# Patient Record
Sex: Female | Born: 1960 | Race: White | Hispanic: No | Marital: Married | Smoking: Never smoker
Health system: Southern US, Community
[De-identification: ages and names within clinical notes are randomized; demographics above are authoritative.]

## PROBLEM LIST (undated history)

## (undated) DIAGNOSIS — D126 Benign neoplasm of colon, unspecified: Secondary | ICD-10-CM

## (undated) DIAGNOSIS — I499 Cardiac arrhythmia, unspecified: Secondary | ICD-10-CM

## (undated) HISTORY — DX: Benign neoplasm of colon, unspecified: D12.6

## (undated) HISTORY — DX: Cardiac arrhythmia, unspecified: I49.9

## (undated) HISTORY — PX: OTHER SURGICAL HISTORY: SHX169

---

## 1997-07-01 ENCOUNTER — Ambulatory Visit (HOSPITAL_COMMUNITY): Admission: RE | Admit: 1997-07-01 | Discharge: 1997-07-01 | Payer: Self-pay | Admitting: Obstetrics & Gynecology

## 1999-09-11 ENCOUNTER — Other Ambulatory Visit: Admission: RE | Admit: 1999-09-11 | Discharge: 1999-09-11 | Payer: Self-pay | Admitting: Obstetrics & Gynecology

## 2000-11-27 ENCOUNTER — Other Ambulatory Visit: Admission: RE | Admit: 2000-11-27 | Discharge: 2000-11-27 | Payer: Self-pay | Admitting: Obstetrics & Gynecology

## 2001-12-01 ENCOUNTER — Other Ambulatory Visit: Admission: RE | Admit: 2001-12-01 | Discharge: 2001-12-01 | Payer: Self-pay | Admitting: Obstetrics & Gynecology

## 2003-07-25 ENCOUNTER — Other Ambulatory Visit: Admission: RE | Admit: 2003-07-25 | Discharge: 2003-07-25 | Payer: Self-pay | Admitting: Obstetrics & Gynecology

## 2004-07-26 ENCOUNTER — Other Ambulatory Visit: Admission: RE | Admit: 2004-07-26 | Discharge: 2004-07-26 | Payer: Self-pay | Admitting: Obstetrics & Gynecology

## 2006-12-31 ENCOUNTER — Ambulatory Visit: Payer: Self-pay | Admitting: Cardiology

## 2007-01-01 ENCOUNTER — Ambulatory Visit: Payer: Self-pay | Admitting: Cardiology

## 2007-01-30 ENCOUNTER — Ambulatory Visit: Payer: Self-pay | Admitting: Cardiology

## 2007-02-09 ENCOUNTER — Ambulatory Visit: Payer: Self-pay

## 2007-02-09 ENCOUNTER — Encounter: Payer: Self-pay | Admitting: Cardiology

## 2007-03-18 ENCOUNTER — Ambulatory Visit: Payer: Self-pay | Admitting: Cardiology

## 2007-05-19 ENCOUNTER — Ambulatory Visit: Payer: Self-pay | Admitting: Cardiology

## 2007-11-20 ENCOUNTER — Ambulatory Visit: Payer: Self-pay | Admitting: Cardiology

## 2008-09-23 ENCOUNTER — Encounter (INDEPENDENT_AMBULATORY_CARE_PROVIDER_SITE_OTHER): Payer: Self-pay | Admitting: *Deleted

## 2008-12-07 DIAGNOSIS — R0602 Shortness of breath: Secondary | ICD-10-CM | POA: Insufficient documentation

## 2008-12-07 DIAGNOSIS — R000 Tachycardia, unspecified: Secondary | ICD-10-CM | POA: Insufficient documentation

## 2008-12-07 DIAGNOSIS — R002 Palpitations: Secondary | ICD-10-CM | POA: Insufficient documentation

## 2008-12-07 DIAGNOSIS — D126 Benign neoplasm of colon, unspecified: Secondary | ICD-10-CM | POA: Insufficient documentation

## 2008-12-08 ENCOUNTER — Ambulatory Visit: Payer: Self-pay | Admitting: Cardiology

## 2008-12-08 DIAGNOSIS — R5383 Other fatigue: Secondary | ICD-10-CM

## 2008-12-08 DIAGNOSIS — R5381 Other malaise: Secondary | ICD-10-CM | POA: Insufficient documentation

## 2008-12-13 ENCOUNTER — Telehealth: Payer: Self-pay | Admitting: Cardiology

## 2008-12-29 ENCOUNTER — Telehealth: Payer: Self-pay | Admitting: Cardiology

## 2008-12-29 ENCOUNTER — Encounter (INDEPENDENT_AMBULATORY_CARE_PROVIDER_SITE_OTHER): Payer: Self-pay | Admitting: *Deleted

## 2009-01-03 ENCOUNTER — Telehealth: Payer: Self-pay | Admitting: Cardiology

## 2009-11-27 ENCOUNTER — Ambulatory Visit: Payer: Self-pay

## 2009-11-27 ENCOUNTER — Encounter: Payer: Self-pay | Admitting: Cardiology

## 2009-11-29 ENCOUNTER — Ambulatory Visit: Payer: Self-pay

## 2009-11-29 ENCOUNTER — Ambulatory Visit: Payer: Self-pay | Admitting: Cardiology

## 2010-05-08 NOTE — Assessment & Plan Note (Signed)
Summary: 1 yr rov fatigue palps  pfh,rn  Medications Added CALTRATE 600+D PLUS 600-400 MG-UNIT TABS (CALCIUM CARBONATE-VIT D-MIN) take one tablet two times a day      Allergies Added: NKDA  Visit Type:  Follow-up Primary Provider:  Dr. Aldona Bar  CC:  palpitations.  History of Present Illness: The patient presents for evaluation of tachycardia palpitations. These are similar to what she had before. She's been noted to have a sinus tachycardia. She's been treated with a low dose of beta blocker. She says she still gets these. She is anxious because her heart rate is elevated today at 98 which is higher than it was last year. She occasionally gets dizziness. She still gets some dyspnea with activity such as walking. She's not had any frank syncope. She has not had any new chest pressure, neck or arm discomfort. She's not had any new PND or orthopnea. She feels fatigued and just "doesn't feel well.  Current Medications (verified): 1)  Multivitamins   Tabs (Multiple Vitamin) .... Daily 2)  Metoprolol Tartrate 25 Mg Tabs (Metoprolol Tartrate) .... One By Mouth Two Times A Day 3)  Caltrate 600+d Plus 600-400 Mg-Unit Tabs (Calcium Carbonate-Vit D-Min) .... Take One Tablet Two Times A Day  Allergies (verified): No Known Drug Allergies  Past History:  Past Medical History: Reviewed history from 12/07/2008 and no changes required. COLONIC POLYPS (ICD-211.3) DYSLIPIDEMIA (ICD-272.4) DM (ICD-250.00) TACHYCARDIA (ICD-785.0) DYSPNEA (ICD-786.05) PALPITATIONS (ICD-785.1)  Past Surgical History: Reviewed history from 12/08/2008 and no changes required. Colonic polyps removed in 2007.      Review of Systems       As stated in the HPI and negative for all other systems.   Vital Signs:  Patient profile:   50 year old female Height:      63 inches Weight:      161 pounds BMI:     28.62 Pulse rate:   110 / minute Pulse rhythm:   regular BP sitting:   123 / 82  (right arm) Cuff size:    regular  Vitals Entered By: Marrion Coy, CNA (November 27, 2009 4:44 PM)  Physical Exam  General:  Well developed, well nourished, in no acute distress. Head:  normocephalic and atraumatic Mouth:  Teeth, gums and palate normal. Oral mucosa normal. Neck:  Neck supple, no JVD. No masses, thyromegaly or abnormal cervical nodes. Chest Wall:  no deformities or breast masses noted Lungs:  Clear bilaterally to auscultation and percussion. Abdomen:  Bowel sounds positive; abdomen soft and non-tender without masses, organomegaly, or hernias noted. No hepatosplenomegaly. Msk:  Back normal, normal gait. Muscle strength and tone normal. Extremities:  No clubbing or cyanosis. Neurologic:  Alert and oriented x 3. Skin:  Intact without lesions or rashes. Cervical Nodes:  no significant adenopathy Axillary Nodes:  no significant adenopathy Inguinal Nodes:  no significant adenopathy Psych:  Normal affect.   Detailed Cardiovascular Exam  Neck    Carotids: Carotids full and equal bilaterally without bruits.      Neck Veins: Normal, no JVD.    Heart    Inspection: no deformities or lifts noted.      Palpation: normal PMI with no thrills palpable.      Auscultation: regular rate and rhythm, S1, S2 without murmurs, rubs, gallops, or clicks.    Vascular    Abdominal Aorta: no palpable masses, pulsations, or audible bruits.      Femoral Pulses: normal femoral pulses bilaterally.      Pedal Pulses: normal pedal  pulses bilaterally.      Radial Pulses: normal radial pulses bilaterally.      Peripheral Circulation: no clubbing, cyanosis, or edema noted with normal capillary refill.     EKG  Procedure date:  11/27/2009  Findings:      Sinus rhythm, rate 98, axis within normal limits, intervals within normal limits, nonspecific inferior T-wave changes  Impression & Recommendations:  Problem # 1:  TACHYCARDIA (ICD-785.0) At this point I would not suggest increasing her beta blocker as she has had  low blood pressures and I think she would be more symptomatic with med titration. Rather I spent quite a bit of time (greater than 30 minutes) discussing with her husband and the patient with values of exercise in this situation. I think prescribing and complying with an exercise regimen would make her feel much better and any further medical therapy. Toward that end I will her back for an exercise treadmill test to give her a prescription for exercise. Orders: EKG w/ Interpretation (93000) Treadmill (Treadmill)  Problem # 2:  DYSPNEA (ICD-786.05) The patient has dyspnea and nonspecific EKG changes. I think the pretest probability of obstructive coronary disease is low. I will evaluate however with an exercise treadmill test. Orders: Treadmill (Treadmill)  Problem # 3:  FATIGUE (ICD-780.79) I suspect that this is multifactorial and would improve with exercise.  Patient Instructions: 1)  Your physician recommends that you schedule a follow-up appointment iat the time of the treadmill 2)  Your physician recommends that you continue on your current medications as directed. Please refer to the Current Medication list given to you today. 3)  Your physician has requested that you have an exercise tolerance test.  For further information please visit [email or web address]  Please also follow instruction sheet, as given. Prescriptions: METOPROLOL TARTRATE 25 MG TABS (METOPROLOL TARTRATE) one by mouth two times a day  #60 x 11   Entered by:   Charolotte Capuchin, RN   Authorized by:   Rollene Rotunda, MD, [facility]   Signed by:   Charolotte Capuchin, RN on 11/27/2009   Method used:   Electronically to        [facility] [phone] (retail)       [address]       [place], [place]  [address]       Ph: [phone]       Fax: [phone]   RxID:   [id number]

## 2010-08-21 NOTE — Assessment & Plan Note (Signed)
[facility]                            CARDIOLOGY OFFICE NOTE   NAME:Carol [name], Carol [name]                      MRN:          [id number]  DATE:03/18/2007                            DOB:          1961-01-05    REASON FOR PRESENTATION:  Evaluate patient with tachycardia.   HISTORY OF PRESENT ILLNESS:  The patient presents for followup of the  above.  At the last visit I started her on propranolol 10 mg b.i.d.  She  did have some improvement with this, and had less noticeable tachy  palpitations until recently.  She had an upper respiratory infection and  saw Dr. Salli Quarry in the urgent care this weekend.  She was given  amoxicillin and Mucinex.  Yesterday she was very dizzy and nauseated.  Of note her heart rate was in the 120's when she was in [facility].  It  is being considered that she should stop the amoxicillin and switch to  another antibiotic.  Question is could the antibiotic have been related  to the increased heart rate.  She had no presyncope or syncope.  She is  not having any chest pain, PND or orthopnea.  She has cough and  congestion.   Of note the patient did have an echocardiogram after the last visit that  demonstrated a well-preserved ejection fraction,  no evidence of  valvular abnormalities.   PAST MEDICAL HISTORY:  There is no history of hypertension, diabetes or  hyperlipidemia.  Colonic polyp removed 2007.   ALLERGIES:  None.   MEDICATIONS:  1. Multivitamin.  2. Propranolol 10 mg b.i.d.   REVIEW OF SYSTEMS:  As stated in the HPI, and, otherwise, negative for  other systems.   PHYSICAL EXAMINATION:  GENERAL:  The patient is in no distress.  VITAL SIGNS:  Blood pressure 127/83, heart rate 116 and regular, weight  156 pounds, body mass index 26.  HEENT:  Eyes unremarkable, pupils equal, round, and react to light.  Fundi not visualized.  Oral mucosa unremarkable.  NECK:  No jugular venous distention at 45 degrees.   Carotid upstrokes  brisk and symmetric.  No bruits, no thyromegaly.  LYMPHATICS:  No cervical, axillary, inguinal adenopathy.  LUNGS:  Clear to auscultation bilaterally.  BACK:  No costovertebral angle tenderness.  CHEST:  Unremarkable.  HEART:  PMI not displaced or sustained.  S1 and S2 within normal limits.  No S3, no S4.  No clicks, no rubs, no murmurs.  ABDOMEN:  Obese, positive bowel sounds normal in frequency and pitch.  No bruits, no rebound, no guarding, no  midline pulsatile mass.  No  hepatomegaly, no splenomegaly.  SKIN:  No rashes or nodules.  EXTREMITIES:  2+ pulses, no edema.   ASSESSMENT/PLAN:  1. Tachy palpitations.  The patient has sinus tachycardia that is      bothersome to her.  I do not see a secondary cause for this.  I am      managing this symptomatically.  I will increase her propranolol to      20 mg b.i.d.  I think it is fine for her  to take any antibiotic as      necessary, and doubt that the medication is related to her tachy      palpitations.  However, any time the patient gets under stress      whether physical or emotional I suspect she will have a rapid heart      rate.  2. Followup.  I will see the patient back in about two months or      sooner if she has any increased palpitations.     Rollene Rotunda, MD, [facility]  Electronically Signed    JH/MedQ  DD: 03/18/2007  DT: 03/19/2007  Job #: [id number]   cc:   Gerrit Friends. Aldona Bar, M.D.

## 2010-08-21 NOTE — Assessment & Plan Note (Signed)
[facility]                            CARDIOLOGY OFFICE NOTE   NAME:Kovarik, JANAT TABBERT                      MRN:          [id number]  DATE:05/19/2007                            DOB:          1960/11/25    PRIMARY CARE PHYSICIAN:  Gerrit Friends. Aldona Bar, M.D.   REASON FOR PRESENTATION:  Evaluate the patient with tachycardia.   HISTORY OF PRESENT ILLNESS:  The patient is a 50 year old.  She presents  for followup of tachycardia.  Last visit, I did increase her propranolol  from 10 b.i.d.  to 20 b.i.d. She says she is having less tachy  palpitations unless when she really hurries.  Her resting heart rate is  slower today.  She is not having any new tiredness, but has some  baseline fatigue.  She says she is not sleeping as well at night and  thinks this may be the beta blocker.  She takes a little Tylenol PM and  this seems to help.  She is doing that sporadically.  She is very  concerned about a 7 pounds weight gain she has had in the last 6 weeks.  She blames this on the beta blockers and she has not changed her  exercise or diet.  She denies any pre-syncope or syncope.  She is not  having any chest discomfort, neck or arm discomfort.  She has some vague  shortness of breath with activity, but this does not seem to be a new  complaint.   PAST MEDICAL HISTORY:  1. Tachycardia (idiopathic sinus tachycardia).  2. Hypertension.  3. Diabetes.  4. Dyslipidemia.  5. Colonic polyps removed 2007.   ALLERGIES:  None.   MEDICATIONS:  1. Multivitamin.  2. Propranolol 20 mg b.i.d.   REVIEW OF SYSTEMS:  As stated in the HPI, otherwise negative for other  systems.   PHYSICAL EXAMINATION:  The patient is in no acute distress.  Blood  pressure 110/86, heart rate 88 and regular, weight 163 pounds, body mass  index 27.  HEENT: Eyelids unremarkable. Pupils equal, round, and reactive to light.  Fundi not visualized. Oral mucosa is unremarkable.  NECK: No jugular  venous distention at 45 degrees. Carotid upstroke brisk  and symmetrical. No bruits, no thyromegaly.  LYMPHATICS: No cervical, axillary or inguinal adenopathy.  LUNGS: Clear to auscultation bilaterally.  BACK: No costovertebral angle tenderness.  CHEST: Unremarkable.  HEART: PMI not displaced or sustained. S1, S2 within normal limits. No  S3. No S4. No clicks, rub or murmurs.  ABDOMEN: Flat, positive bowel sounds, normal in frequency and pitch. No  bruits. No rebound. No guarding. No midline pulsatile mass. No  hepatomegaly, splenomegaly.  SKIN: No rashes, no nodules.  EXTREMITIES: 2+ pulses throughout. No edema, cyanosis or clubbing.  NEURO: Oriented to person, place and time. Cranial nerves II-XII grossly  intact. Motor grossly intact.   EKG: Sinus rhythm, rate 88, axis within normal limits, intervals within  normal limits, no acute ST-T wave changes.   ASSESSMENT/PLAN:  1. Tachy palpitations.  The patient is not having as much of these.      She  is worried about the side effects of the propranolol.  It does      seem to be working for the rapid heart rate.  I did tell her that      she should count calories and perhaps increase her exercising and      her gave her specific instructions on this.  This would be in my      mind preferable to going down on the propranolol which seems to be      working.  However, if she really does not like this medication she      could go back to 10 b.i.d. propranolol and see how she feels and      what happens with her heart rate.  Still she would need, healthy      lifestyle decisions like calorie counting and increased exercise.      A long discussion (greater than 1/2 hour) about these choices.  2. Dyslipidemia.  The patient has some mild dyslipidemia.  We      discussed this in the past.  She hopefully is complying with diet      and lifestyle changes.  3. Insomnia.  I doubt that this is related to the medication.  She was      having this  complaint before we increased the dose.  She is to      follow with the primary care giver for continued evaluation of      this.  4. Followup.  I will see her back in about 6 months or sooner if      needed.     Rollene Rotunda, MD, [facility]  Electronically Signed    JH/MedQ  DD: 05/19/2007  DT: 05/20/2007  Job #: [id number]

## 2010-08-21 NOTE — Assessment & Plan Note (Signed)
[facility]                            CARDIOLOGY OFFICE NOTE   NAME:Carol [name], Carol [name]                      MRN:          [id number]  DATE:12/31/2006                            DOB:          1960-12-21    REFERRING PHYSICIAN:  Gerrit Friends. Aldona Bar, M.D.   REASON FOR VISIT:  Evaluate patient with palpitations and shortness of  breath.   HISTORY OF PRESENT ILLNESS:  The patient is a pleasant 50 year old white  female with a history of palpitations some 25 years ago.  She remembers  being treated with Lanoxin.  She said there was a questionable diagnosis  of mitral valve prolapse though she does not recall ever having an  echocardiogram.  She has been off of medications for many years.  However, over the past 6-8 months she has noticed palpitations.  She  thinks these happen daily.  They happen sporadically.  She notices them  at rest.  She cannot bring them on.  She described skipped heartbeats  that last for a few seconds.  She does get short of breath she thinks at  the same time.  She has no sustained symptoms and has not had any  presyncope or syncope.  She does not describe chest discomfort, neck or  arm discomfort.  She does have some shortness of breath climbing a  flight of stairs, but this has been chronic.  She is not having any  resting shortness of breath, denies any PND or orthopnea.  She has had  no weight loss, fevers or chills.  She is comfortable at room  temperature.  She has had no change in her hair or voice.  She did have  blood work to include normal electrolytes and TSH by Dr. Aldona Bar recently.   PAST MEDICAL HISTORY:  She has no history of hypertension, diabetes, or  hyperlipidemia.  She did recently have a lipid profile drawn which  demonstrates her LDL to be 129 with an HDL greater than 50.   PAST SURGICAL HISTORY:  Colonic polyp removed in 2007.   ALLERGIES:  No known drug allergies.   MEDICATIONS:  Birth control pills.   SOCIAL  HISTORY:  The patient is married.  She works in Airline [employer].  She has  two grown children.  She does not smoke cigarettes and never has.  She  occasionally drinks alcohol.   FAMILY HISTORY:  Noncontributory for early coronary artery disease,  heart failure, sudden cardiac death, or syncope.   REVIEW OF SYSTEMS:  As stated in the HPI and otherwise positive for  headaches.  Negative for other systems.   PHYSICAL EXAMINATION:  GENERAL:  The patient is in no distress.  VITAL SIGNS:  Blood pressure 130/90, heart rate 111 and regular, body  mass index 26, weight 153 pounds.  HEENT:  Eye lids unremarkable, pupils equal, round, and reactive to  light, fundi not visualized.  Oral mucosa unremarkable.  NECK:  No jugular venous distention to 45 degrees.  Carotid upstroke  brisk and symmetric, no bruits and no thyromegaly.  LYMPHATICS:  No cervical, axillary, or inguinal adenopathy.  LUNGS:  Clear to auscultation bilaterally.  BACK:  No costovertebral angle tenderness.  CHEST:  Unremarkable.  HEART:  PMI not displaced or sustained.  S1 and S2 within normal limits.  No S3, no S4.  No clicks, rubs, murmurs.  ABDOMEN:  Flat, positive bowel sounds normal in frequency and pitch.  No  bruits, no rebound, no guarding, no midline pulsatile mass, no  hepatomegaly, and no splenomegaly.  SKIN:  No rashes and no nodules.  EXTREMITIES:  2+ pulses throughout, no cyanosis, clubbing, or edema.  NEUROLOGY:  Oriented to person, place, and time.  Cranial nerves II-XII  grossly intact.  Motor grossly intact.   EKG; sinus rhythm, rate 111, axis within normal limits, intervals within  normal limits, poor anterior R wave progression, no acute ST T wave  changes.   ASSESSMENT:  1. Palpitations.  The patient has palpitations.  These are probably      PAC's or PVC's.  She has had normal electrolytes.  I think her      physical examination is otherwise unremarkable (in particular I do      not hear any evidence of mitral  valve prolapse).  At this point I      am going to start with a 48-hour Holter monitor which she is pretty      sure will capture these symptoms.  She may benefit from treatment      with a calcium channel blocker, beta blocker.  2. Dyspnea.  This seems to occur with the palpitations.  I will      further evaluate this based on the results of the above.  At this      point I do not think she has any structural heart disease.  3. Tiredness.  The patient says she does not sleep well at night.      This may be the etiology of daytime fatigue.  I have asked her to      discuss this with her primary doctor.   FOLLOWUP:  I would like to see her back in about four weeks or sooner if  needed.     Rollene Rotunda, MD, [facility]  Electronically Signed    JH/MedQ  DD: 12/31/2006  DT: 01/01/2007  Job #: [id number]   cc:   Gerrit Friends. Aldona Bar, M.D.

## 2010-08-21 NOTE — Assessment & Plan Note (Signed)
[facility]                            CARDIOLOGY OFFICE NOTE   NAME:Carol [name]                      MRN:          [id number]  DATE:01/30/2007                            DOB:          [birth year]    PRIMARY CARE PHYSICIAN:  Gerrit Friends. Aldona Bar, M.D.   REASON FOR PRESENTATION:  Patient with palpitations.   HISTORY OF PRESENT ILLNESS:  The patient is a 50 year old.  She presents  for follow-up after Holter monitor.  This demonstrated that she had  sinus tachycardia with occasional PACs and PVCs.  She has a normal  diurnal variation with her heart rate, although it is at a baseline  elevated.  Her average heart rate is 91.  She dips into the 60s with  sleep.  She goes up to 161 with activity.  There were no automatic  dysrhythmias.   The patient has had no new symptoms since the last appointment, though  she is still feeling a tachy arrhythmia.  She has had no presyncope or  syncope.  She has had no chest discomfort, neck, or arm discomfort.   PAST MEDICAL HISTORY:  She has no history of hypertension, diabetes, or  hyperlipidemia.   PAST SURGICAL HISTORY:  Colonic polyp removed in 2007.   ALLERGIES:  None.   MEDICATIONS:  Multivitamin.   REVIEW OF SYSTEMS:  As stated in the HPI, otherwise negative for other  systems.   PHYSICAL EXAMINATION:  The patient is in no distress.  Blood pressure 118/88, heart rate 115 and regular, weight 156 pounds,  body mass index 26.  HEENT:  Eyelids unremarkable.  Pupils equal, round, and reactive to  light.  Fundi not visualized.  Oral mucosa unremarkable.  NECK:  No jugular venous distention, wave form within normal limits.  Carotid upstrokes brisk and symmetric.  No bruits or thyromegaly.  LYMPHATICS:  No lymphadenopathy.  LUNGS:  Clear to auscultation bilaterally.  BACK:  No costovertebral angle tenderness.  CHEST:  Unremarkable.  HEART:  PMI not displaced or sustained.  S1 and S2 within normal limits.  No  S3, no S4, no clicks, no rubs, no murmurs.  ABDOMEN:  Flat.  Positive bowel sounds, normal in frequency and pitch.  No bruits, no rebound, no guarding, no midline pulsatile mass, no  hepatomegaly, no splenomegaly.  SKIN:  No rashes, no nodules.  EXTREMITIES:  2+ pulses throughout.  No edema, no cyanosis, or clubbing.  NEUROLOGIC:  Oriented to person, place, and time.  Cranial nerves II-XII  grossly intact.  Motor grossly intact throughout.   ASSESSMENT/PLAN:  1. Palpitations.  The patient has an elevated resting heart rate.  She      has an appropriate response with activities and rest.  However, to      further evaluate this baseline tachycardia, will get an      echocardiogram.  I do not strongly suspect structural heart      disease, but want to rule out occult left ventricular dysfunction      or valvular abnormalities.  Also I am going to give her a  prescription for Propranolol 10 mg twice a day.  We discussed use      of this as a symptomatic medication.  2. Follow-up.  I would like to see the patient back in about two      months or sooner if needed.     Rollene Rotunda, MD, [facility]  Electronically Signed    JH/MedQ  DD: 01/30/2007  DT: 01/31/2007  Job #: [id number]   cc:   Gerrit Friends. Aldona Bar, M.D.

## 2010-08-21 NOTE — Assessment & Plan Note (Signed)
[facility]                            CARDIOLOGY OFFICE NOTE   NAME:[name] [name] LEESON                      MRN:          [id number]  DATE:11/20/2007                            DOB:          1961-03-04    PRIMARY CARE PHYSICIAN:  [name] [name], M.D.   REASON FOR PRESENTATION:  Evaluate patient with tachycardia.   HISTORY OF PRESENT ILLNESS:  The patient is now 50 years old.  She  presents for 42-month followup.  Since I last saw her she has not noticed  any of the tachycardia or palpitations that she was having.  She is not  having any presyncope or syncope.  She does not have any chest  discomfort, neck or arm discomfort.  She still gets dyspneic when she  walks a long distance, but not when she is doing her usual active day.  She does not have any resting shortness of breath.  She is not  describing any PND or orthopnea.  She continues to gain some weight  slowly.   PAST MEDICAL HISTORY:  Tachycardia (idiopathic sinus tachycardia),  hypertension, diabetes, dyslipidemia, colonic polyps removed in 2007.   ALLERGIES:  None.   MEDICATIONS:  1. Multivitamin.  2. Propranolol 20 mg b.i.d..   REVIEW OF SYSTEMS:  As stated in HPI and otherwise negative for other  systems.   PHYSICAL EXAMINATION:  GENERAL:  The patient is in no distress.  VITAL SIGNS:  Blood pressure 116/79, heart rate 89 and regular, weight  168 pounds.  NECK:  No jugular venous distention at 45 degrees, carotid upstroke  brisk and symmetrical.  No bruits, no thyromegaly.  LYMPHATICS:  No adenopathy.  LUNGS:  Clear to auscultation bilaterally.  CHEST:  Unremarkable.  HEART:  PMI not displaced or sustained, S1 and S2 within normal limits,  no S3, no S4, no murmurs.  ABDOMEN:  Flat, positive bowel sounds normal in frequency and pitch, no  bruits, no rebound, no guarding, no midline pulsatile mass, no  hepatomegaly, no splenomegaly.  SKIN:  No rashes, no nodules.  EXTREMITIES:  2+  pulse throughout, no edema, no cyanosis, no clubbing.  NEURO:  Oriented to person, place, and time, cranial nerves II-XII are  grossly intact, motor grossly intact.   EKG sinus rhythm, rate 79, axis within normals limits, intervals within  normal limits, no acute ST-wave changes.   ASSESSMENT AND PLAN:  1. Palpitations.  The patient is no longer having any symptomatic      tachycardia.  She is tolerating the propranolol.  No further      cardiovascular testing is planned.  2. Dyspnea.  The patient has some mild dyspnea with exertion.  She has      normal left ventricular function.  I do not suspected a cardiogenic      etiology,  rather I think it is probably deconditioning and lack of      exercise.  We discussed this at length and hopefully she will start      an exercise regimen.  I discussed with her some specifics.  3. Weight.  This can  go down with increased exercise and decreased      caloric intake, and we discussed this.  4. Followup.  We will see the patient again in 1-year or sooner if she      has any acute problems.     Rollene Rotunda, MD, [facility]  Electronically Signed    JH/MedQ  DD: 11/20/2007  DT: 11/21/2007  Job #: [id number]   cc:   [name] [name], M.D.

## 2010-12-07 ENCOUNTER — Encounter: Payer: Self-pay | Admitting: Cardiology

## 2010-12-11 ENCOUNTER — Ambulatory Visit (INDEPENDENT_AMBULATORY_CARE_PROVIDER_SITE_OTHER): Payer: BC Managed Care – PPO | Admitting: Cardiology

## 2010-12-11 ENCOUNTER — Other Ambulatory Visit: Payer: Self-pay

## 2010-12-11 ENCOUNTER — Encounter: Payer: Self-pay | Admitting: Cardiology

## 2010-12-11 VITALS — BP 118/72 | HR 83 | Ht 60.0 in | Wt 164.0 lb

## 2010-12-11 DIAGNOSIS — R002 Palpitations: Secondary | ICD-10-CM

## 2010-12-11 MED ORDER — METOPROLOL TARTRATE 25 MG PO TABS: 25.0000 mg | ORAL_TABLET | Freq: Two times a day (BID) | ORAL | Status: DC

## 2010-12-11 NOTE — Progress Notes (Signed)
HPI The patient returns for follow up of palpitations.  Since I last saw her she has done well.  She has rare palpitations but no syncope or presyncope.  She has had no chest pain, neck or arm pain.  She has no SOB, PND or orthopnea.  She does exercise routinely.  No Known Allergies  Current Outpatient Prescriptions  Medication Sig Dispense Refill  . metoprolol tartrate (LOPRESSOR) 25 MG tablet Take 1 tablet (25 mg total) by mouth 2 (two) times daily.  60 tablet  6  . Multiple Vitamin (MULTIVITAMIN) tablet Take 1 tablet by mouth daily.          Past Medical History  Diagnosis Date  . Arrhythmia     tachycardia  &palpitations  . Benign neoplasm of colon   . Other and unspecified hyperlipidemia   . Diabetes mellitus     No past surgical history on file.  ROS:  As stated in the HPI and negative for all other systems.  PHYSICAL EXAM BP 118/72  Pulse 83  Ht 5' (1.524 m)  Wt 164 lb (74.39 kg)  BMI 32.03 kg/m2 GENERAL:  Well appearing HEENT:  Pupils equal round and reactive, fundi not visualized, oral mucosa unremarkable NECK:  No jugular venous distention, waveform within normal limits, carotid upstroke brisk and symmetric, no bruits, no thyromegaly LYMPHATICS:  No cervical, inguinal adenopathy LUNGS:  Clear to auscultation bilaterally BACK:  No CVA tenderness CHEST:  Unremarkable HEART:  PMI not displaced or sustained,S1 and S2 within normal limits, no S3, no S4, no clicks, no rubs, no murmurs ABD:  Flat, positive bowel sounds normal in frequency in pitch, no bruits, no rebound, no guarding, no midline pulsatile mass, no hepatomegaly, no splenomegaly EXT:  2 plus pulses throughout, no edema, no cyanosis no clubbing SKIN:  No rashes no nodules NEURO:  Cranial nerves II through XII grossly intact, motor grossly intact throughout PSYCH:  Cognitively intact, oriented to person place and time   EKG:  Sinus rhythm, rate 83, axis within normal limits, intervals within normal limits,  no acute ST-T wave changes.   ASSESSMENT AND PLAN

## 2010-12-11 NOTE — Assessment & Plan Note (Signed)
At this point no further testing or change in therapy is indicated. I couldn't see her back as needed. I asked her to check with Caprice Beaver, MD to see if he would prescribe the beta blocker.  We again discussed her nonspecific EKG changes from previous.  I reassured her.

## 2010-12-11 NOTE — Patient Instructions (Signed)
The current medical regimen is effective;  continue present plan and medications.  Follow up as needed 

## 2010-12-11 NOTE — Telephone Encounter (Signed)
..   Requested Prescriptions   Pending Prescriptions Disp Refills  . METOPROLOL TARTRATE 25 MG PO TABS 60 tablet 6    Sig: Take 1 tablet (25 mg total) by mouth 2 (two) times daily.

## 2010-12-12 ENCOUNTER — Telehealth: Payer: Self-pay | Admitting: Cardiology

## 2010-12-12 DIAGNOSIS — R002 Palpitations: Secondary | ICD-10-CM

## 2010-12-12 NOTE — Telephone Encounter (Signed)
Pt calling regarding the number of refills on pt metroprol RX written yesterday. Pt said Dr. Antoine Poche told her he would give her 12 refills, however when pt picked up her refill today it was only for 6 refills. Pt said she only sees Dr. Antoine Poche once a year therefore she needs a order written for 6 more refills.    Please return pt call to discuss.

## 2010-12-13 MED ORDER — METOPROLOL TARTRATE 25 MG PO TABS: 25.0000 mg | ORAL_TABLET | Freq: Two times a day (BID) | ORAL | Status: DC

## 2010-12-13 NOTE — Telephone Encounter (Signed)
Pt requesting refills for 1 year.  Refilled for one year. Left message for pt.

## 2011-03-11 ENCOUNTER — Ambulatory Visit: Payer: Self-pay

## 2015-04-14 ENCOUNTER — Ambulatory Visit (INDEPENDENT_AMBULATORY_CARE_PROVIDER_SITE_OTHER): Payer: BLUE CROSS/BLUE SHIELD | Admitting: Physician Assistant

## 2015-04-14 VITALS — BP 112/80 | HR 83 | Temp 98.0°F | Resp 16 | Ht 60.0 in | Wt 171.4 lb

## 2015-04-14 DIAGNOSIS — J069 Acute upper respiratory infection, unspecified: Secondary | ICD-10-CM | POA: Diagnosis not present

## 2015-04-14 MED ORDER — GUAIFENESIN ER 1200 MG PO TB12: 1.0000 | ORAL_TABLET | Freq: Two times a day (BID) | ORAL | Status: DC | PRN

## 2015-04-14 NOTE — Patient Instructions (Signed)
If prescribed mucinex, take twice a day with plenty of water (64 oz per day). Use afrin nasal spray twice a day Hot showers, breathing in steam from shower or pot of boiling onions, eating spicy food and neti pot with sterile water can all help your sinuses drain. If you are not getting better in 7 days, return to clinic

## 2015-04-14 NOTE — Progress Notes (Signed)
[facility] [address], [place] [address] [phone]  Date:  04/14/2015   Name:  Carol [name]   DOB:  1960/09/17   MRN:  [id number]  PCP:  Paulo Fruit, MD    Chief Complaint: Nasal Congestion; sinus pressure; and took otc medication   History of Present Illness:  This is a 55 y.o. female with PMH tachycardia who is presenting with sinus pressure and nasal congestion x 5 days. States not getting worse but not getting any better. States she had similar illness 2-3 weeks ago and went away. Came back after her husband developed uri symptoms. She states "we seem to be passing it back and forth". Denies facial pain, sore throat, otalgia, fever, chills. Took zicam and tylenol cold and flu.  No hx asthma or env allergies. Not a smoker.   Review of Systems:  Review of Systems See HPI  Patient Active Problem List   Diagnosis Date Noted  . FATIGUE 12/08/2008  . COLONIC POLYPS 12/07/2008  . TACHYCARDIA 12/07/2008  . PALPITATIONS 12/07/2008  . DYSPNEA 12/07/2008    Prior to Admission medications   Medication Sig Start Date End Date Taking? Authorizing Provider  metoprolol tartrate (LOPRESSOR) 25 MG tablet Take 1 tablet (25 mg total) by mouth 2 (two) times daily. 12/13/10  Yes Minus Breeding, MD  Multiple Vitamin (MULTIVITAMIN) tablet Take 1 tablet by mouth daily.     Yes Historical Provider, MD    No Known Allergies  History reviewed. No pertinent past surgical history.  Social History  Substance Use Topics  . Smoking status: Never Smoker   . Smokeless tobacco: None  . Alcohol Use: None    History reviewed. No pertinent family history.  Medication list has been reviewed and updated.  Physical Examination:  Physical Exam  Constitutional: She is oriented to person, place, and time. She appears well-developed and well-nourished. No distress.  HENT:  Head: Normocephalic and atraumatic.  Right Ear: Hearing, tympanic membrane, external ear and  ear canal normal.  Left Ear: Hearing, tympanic membrane, external ear and ear canal normal.  Nose: Nose normal. Right sinus exhibits no maxillary sinus tenderness and no frontal sinus tenderness. Left sinus exhibits no maxillary sinus tenderness and no frontal sinus tenderness.  Mouth/Throat: Uvula is midline, oropharynx is clear and moist and mucous membranes are normal.  Eyes: Conjunctivae and lids are normal. Right eye exhibits no discharge. Left eye exhibits no discharge. No scleral icterus.  Cardiovascular: Normal rate, regular rhythm, normal heart sounds and normal pulses.   No murmur heard. Pulmonary/Chest: Effort normal and breath sounds normal. No respiratory distress. She has no wheezes. She has no rhonchi. She has no rales.  Musculoskeletal: Normal range of motion.  Lymphadenopathy:       Head (right side): No submental, no submandibular and no tonsillar adenopathy present.       Head (left side): No submental, no submandibular and no tonsillar adenopathy present.    She has no cervical adenopathy.  Neurological: She is alert and oriented to person, place, and time.  Skin: Skin is warm, dry and intact. No lesion and no rash noted.  Psychiatric: She has a normal mood and affect. Her speech is normal and behavior is normal. Thought content normal.   BP 112/80 mmHg  Pulse 83  Temp(Src) 98 F (36.7 C) (Oral)  Resp 16  Ht 5' (1.524 m)  Wt 171 lb 6.4 oz (77.747 kg)  BMI 33.47 kg/m2  SpO2 98%  LMP  (  Exact Date)  Assessment and Plan:  1. Viral URI Suspect viral etiology. Exam and vitals benign. She will take afrin BID x 3 days and take mucinex BID. Stop zicam and tylenol cold and flu. Return in 1 week if symptoms not improving. - Guaifenesin (MUCINEX MAXIMUM STRENGTH) 1200 MG TB12; Take 1 tablet (1,200 mg total) by mouth every 12 (twelve) hours as needed.  Dispense: 14 tablet; Refill: 1   Nicole V. Drenda Freeze, MHS [facility]  04/14/2015

## 2015-04-22 ENCOUNTER — Telehealth: Payer: Self-pay

## 2015-04-22 NOTE — Telephone Encounter (Signed)
Symptoms are not improving the patient would like something else called in...  Please call to advise [phone]

## 2015-04-25 NOTE — Telephone Encounter (Signed)
Assessment and Plan:  1. Viral URI Suspect viral etiology. Exam and vitals benign. She will take afrin BID x 3 days and take mucinex BID. Stop zicam and tylenol cold and flu. Return in 1 week if symptoms not improving. - Guaifenesin (MUCINEX MAXIMUM STRENGTH) 1200 MG TB12; Take 1 tablet (1,200 mg total) by mouth every 12 (twelve) hours as needed. Dispense: 14 tablet; Refill: 1

## 2015-04-25 NOTE — Telephone Encounter (Signed)
Left message to call back if she needs help.

## 2016-01-11 ENCOUNTER — Ambulatory Visit (INDEPENDENT_AMBULATORY_CARE_PROVIDER_SITE_OTHER): Payer: BLUE CROSS/BLUE SHIELD | Admitting: Cardiovascular Disease

## 2016-01-11 ENCOUNTER — Encounter: Payer: Self-pay | Admitting: Cardiovascular Disease

## 2016-01-11 VITALS — BP 130/82 | HR 90 | Ht 63.0 in | Wt 167.0 lb

## 2016-01-11 DIAGNOSIS — R0600 Dyspnea, unspecified: Secondary | ICD-10-CM | POA: Diagnosis not present

## 2016-01-11 MED ORDER — DILTIAZEM HCL ER COATED BEADS 120 MG PO CP24: 120.0000 mg | ORAL_CAPSULE | Freq: Every day | ORAL | 3 refills | Status: DC

## 2016-01-11 NOTE — Progress Notes (Signed)
Chief Complaint  Patient presents with  . Palpitations    follow up     History of Present Illness: 55 yo female with history of palpitations, DM, HLD who is here today as a new patient for evaluation of dyspnea. She has been followed in the past by Dr. Aldona Bar and Dr. Percival Spanish for palpitations. She last saw Dr. Percival Spanish in 2012. She has been treated with beta blocker therapy. Remote cardiac monitor without arrhythmias.   She tells me today that she has been having dyspnea at rest and with exertion. No chest pain or pressure. No dizziness, near syncope or syncope. No weight change. No LE edema. She is very active. She has wondered in the past if she has asthma. She has occasional blurry vision, not associated with tachycardia or low blood pressure  Primary Care Physician: Paulo Fruit, MD   Past Medical History:  Diagnosis Date  . Arrhythmia    tachycardia  &palpitations  . Benign neoplasm of colon    colon polyp    Past Surgical History:  Procedure Laterality Date  . None      Current Outpatient Prescriptions  Medication Sig Dispense Refill  . diltiazem (CARDIZEM CD) 120 MG 24 hr capsule Take 1 capsule (120 mg total) by mouth daily. 90 capsule 3   No current facility-administered medications for this visit.     No Known Allergies  Social History   Social History  . Marital status: Married    Spouse name: N/A  . Number of children: 2  . Years of education: N/A   Occupational History  . 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 Smoking status: Never Smoker  . Smokeless tobacco: Not on file  . Alcohol use No  . Drug use: No  . Sexual activity: Not on file   Other Topics Concern  . Not on file   Social History Narrative   The patient is married. 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 She does not smoke cigarettes and never has. She occasionally drinks alcohol.    Family History  Problem Relation Age of  Onset  . Lung cancer Father   . CAD Neg Hx     Review of Systems:  As stated in the HPI and otherwise negative.   BP 130/82   Pulse 90   Ht 5\' 3"  (1.6 m)   Wt 167 lb (75.8 kg)   LMP  (Exact Date)   BMI 29.58 kg/m   Physical Examination: General: Well developed, well nourished, NAD  HEENT: OP clear, mucus membranes moist  SKIN: warm, dry. No rashes. Neuro: No focal deficits  Musculoskeletal: Muscle strength 5/5 all ext  Psychiatric: Mood and affect normal  Neck: No JVD, no carotid bruits, no thyromegaly, no lymphadenopathy.  Lungs:Clear bilaterally, no wheezes, rhonci, crackles Cardiovascular: Regular rate and rhythm. No murmurs, gallops or rubs. Abdomen:Soft. Bowel sounds present. Non-tender.  Extremities: No lower extremity edema. Pulses are 2 + in the bilateral DP/PT.  EKG:  EKG is ordered today. The ekg ordered today demonstrates NSR, rate 90 bpm.   Recent Labs: No results found for requested labs within last 8760 hours.   Lipid Panel No results found for: CHOL, TRIG, HDL, CHOLHDL, VLDL, LDLCALC, LDLDIRECT   Wt Readings from Last 3 Encounters:  01/11/16 167 lb (75.8 kg)  04/14/15 171 lb 6.4 oz (77.7 kg)  12/11/10 164 lb (74.4 kg)  Other studies Reviewed: Additional studies/ records that were reviewed today include: . Review of the above records demonstrates:   Assessment and Plan:   1. Dyspnea: Will arrange echo to assess LV function, exclude valvular disease. No signs of volume overload. Her lungs are clear. I will change metoprolol to Cardizem CD 120 mg daily. Her symptoms do not sound suspicious for ischemia. She has no risk factors for CAD. No ischemic workup planned.   Current medicines are reviewed at length with the patient today.  The patient does not have concerns regarding medicines.  The following changes have been made:  no change  Labs/ tests ordered today include:   Orders Placed This Encounter  Procedures  . EKG 12-Lead  .  ECHOCARDIOGRAM COMPLETE    Disposition:   FU with me in 6 weeks  Signed, Lauree Chandler, MD 01/11/2016 10:20 AM    [facility] [place], [place], [place]  [address] Phone: [phone]; Fax: [phone]

## 2016-01-11 NOTE — Patient Instructions (Addendum)
Medication Instructions:    Your physician has recommended you make the following change in your medication:  Stop metoprolol tartrate. Start Cardizem CD 120 mg by mouth daily.    Labwork: none  Testing/Procedures: Your physician has requested that you have an echocardiogram. Echocardiography is a painless test that uses sound waves to create images of your heart. It provides your doctor with information about the size and shape of your heart and how well your heart's chambers and valves are working. This procedure takes approximately one hour. There are no restrictions for this procedure.    Follow-Up: Your physician recommends that you schedule a follow-up appointment in 6-8 weeks. --Scheduled for November 6,2017 at 11:30    Any Other Special Instructions Will Be Listed Below (If Applicable).     If you need a refill on your cardiac medications before your next appointment, please call your pharmacy.

## 2016-01-16 ENCOUNTER — Encounter: Payer: Self-pay | Admitting: Cardiovascular Disease

## 2016-01-17 ENCOUNTER — Other Ambulatory Visit: Payer: Self-pay | Admitting: *Deleted

## 2016-01-17 MED ORDER — DILTIAZEM HCL ER COATED BEADS 120 MG PO CP24: 120.0000 mg | ORAL_CAPSULE | Freq: Every day | ORAL | 3 refills | Status: DC

## 2016-01-23 ENCOUNTER — Other Ambulatory Visit: Payer: Self-pay

## 2016-01-23 ENCOUNTER — Ambulatory Visit (HOSPITAL_COMMUNITY): Payer: BLUE CROSS/BLUE SHIELD | Attending: Cardiology

## 2016-01-23 DIAGNOSIS — I351 Nonrheumatic aortic (valve) insufficiency: Secondary | ICD-10-CM | POA: Diagnosis not present

## 2016-01-23 DIAGNOSIS — E785 Hyperlipidemia, unspecified: Secondary | ICD-10-CM | POA: Insufficient documentation

## 2016-01-23 DIAGNOSIS — E119 Type 2 diabetes mellitus without complications: Secondary | ICD-10-CM | POA: Diagnosis not present

## 2016-01-23 DIAGNOSIS — I340 Nonrheumatic mitral (valve) insufficiency: Secondary | ICD-10-CM | POA: Insufficient documentation

## 2016-01-23 DIAGNOSIS — R0600 Dyspnea, unspecified: Secondary | ICD-10-CM | POA: Diagnosis not present

## 2016-01-23 DIAGNOSIS — I071 Rheumatic tricuspid insufficiency: Secondary | ICD-10-CM | POA: Insufficient documentation

## 2016-01-26 ENCOUNTER — Telehealth: Payer: Self-pay | Admitting: *Deleted

## 2016-01-26 DIAGNOSIS — I059 Rheumatic mitral valve disease, unspecified: Secondary | ICD-10-CM

## 2016-01-26 NOTE — Telephone Encounter (Signed)
Notes Recorded by Burnell Blanks, MD on 01/26/2016 at 12:00 PM EDT Can we let her know that her echo is overall normal with mild valve disease. Repeat in 2 years. Thanks, chris

## 2016-01-26 NOTE — Progress Notes (Signed)
LMTCB

## 2016-01-30 ENCOUNTER — Encounter: Payer: Self-pay | Admitting: Cardiovascular Disease

## 2016-02-12 ENCOUNTER — Ambulatory Visit (INDEPENDENT_AMBULATORY_CARE_PROVIDER_SITE_OTHER): Payer: BLUE CROSS/BLUE SHIELD | Admitting: Cardiovascular Disease

## 2016-02-12 VITALS — BP 126/84 | HR 96 | Ht 63.0 in | Wt 169.0 lb

## 2016-02-12 DIAGNOSIS — R0600 Dyspnea, unspecified: Secondary | ICD-10-CM

## 2016-02-12 DIAGNOSIS — R002 Palpitations: Secondary | ICD-10-CM | POA: Diagnosis not present

## 2016-02-12 DIAGNOSIS — I340 Nonrheumatic mitral (valve) insufficiency: Secondary | ICD-10-CM

## 2016-02-12 NOTE — Progress Notes (Signed)
Chief Complaint  Patient presents with  . Palpitations     History of Present Illness: 55 yo female with history of palpitations who is here today for follow up. I saw her as a new patient for evaluation of dyspnea 01/11/16. She has been followed in the past by Dr. Aldona Bar and Dr. Percival Spanish for palpitations. She last saw Dr. Percival Spanish in 2012. She has been treated with beta blocker therapy. Remote cardiac monitor without arrhythmias. She described dyspnea at rest and with exertion but no chest pain. Echo 01/23/16 with normal LV systolic function, trivial AI, trivial MI, moderate TR. Thyroid studies ok in primary care.   She is here today for follow up. She is having mild dyspnea. This is at rest. She is having almost daily palpitations, last for several seconds and up to one minute. No near syncope or syncope. No chest pain.   Primary Care Physician: Paulo Fruit, MD   Past Medical History:  Diagnosis Date  . Arrhythmia    tachycardia  &palpitations  . Benign neoplasm of colon    colon polyp    Past Surgical History:  Procedure Laterality Date  . None      Current Outpatient Prescriptions  Medication Sig Dispense Refill  . diltiazem (CARDIZEM CD) 120 MG 24 hr capsule Take 1 capsule (120 mg total) by mouth daily. 90 capsule 3   No current facility-administered medications for this visit.     No Known Allergies  Social History   Social History  . Marital status: Married    Spouse name: N/A  . Number of children: 2  . Years of education: N/A   Occupational History  . 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 Smoking status: Never Smoker  . Smokeless tobacco: Never Used  . Alcohol use No  . Drug use: No  . Sexual activity: Not on file   Other Topics Concern  . Not on file   Social History Narrative   The patient is married. 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 She does not smoke cigarettes and never has. She  occasionally drinks alcohol.    Family History  Problem Relation Age of Onset  . Lung cancer Father   . CAD Neg Hx     Review of Systems:  As stated in the HPI and otherwise negative.   BP 126/84   Pulse 96   Ht 5\' 3"  (1.6 m)   Wt 169 lb (76.7 kg)   LMP  (Exact Date)   BMI 29.94 kg/m   Physical Examination: General: Well developed, well nourished, NAD  HEENT: OP clear, mucus membranes moist  SKIN: warm, dry. No rashes. Neuro: No focal deficits  Musculoskeletal: Muscle strength 5/5 all ext  Psychiatric: Mood and affect normal  Neck: No JVD, no carotid bruits, no thyromegaly, no lymphadenopathy.  Lungs:Clear bilaterally, no wheezes, rhonci, crackles Cardiovascular: Regular rate and rhythm. No murmurs, gallops or rubs. Abdomen:Soft. Bowel sounds present. Non-tender.  Extremities: No lower extremity edema. Pulses are 2 + in the bilateral DP/PT.  Echo 01/23/16: Left ventricle: The cavity size was normal. Wall thickness was   normal. Systolic function was normal. The estimated ejection   fraction was in the range of 55% to 60%. Wall motion was normal;   there were no regional wall motion abnormalities. Left   ventricular diastolic function parameters were normal. - Aortic valve: There was trivial regurgitation. - Mitral valve:  There was mild regurgitation. - Tricuspid valve: There was moderate regurgitation. Impressions: - Normal LV systolic and diastolic function; trace AI; mild MR;   moderate TR.  EKG:  EKG is not ordered today. The ekg ordered today demonstrates  Recent Labs: No results found for requested labs within last 8760 hours.   Lipid Panel No results found for: CHOL, TRIG, HDL, CHOLHDL, VLDL, LDLCALC, LDLDIRECT   Wt Readings from Last 3 Encounters:  02/12/16 169 lb (76.7 kg)  01/11/16 167 lb (75.8 kg)  04/14/15 171 lb 6.4 oz (77.7 kg)     Other studies Reviewed: Additional studies/ records that were reviewed today include: . Review of the above  records demonstrates:   Assessment and Plan:   1. Dyspnea: No signs of volume overload. Her lungs are clear. Echo with normal LV function and mild valve disease. Will continue Cardizem CD. Beta blocker was stopped at the last visit. Her symptoms do not sound suspicious for ischemia. She has no risk factors for CAD. No ischemic workup planned.   2. Palpitations: Will arrange 48 hour monitor. She was told during her echo that her heart rate speeds up when she talks.   3. Mitral regurgitation:  Mild by echo. Repeat echo in 3 years.   Current medicines are reviewed at length with the patient today.  The patient does not have concerns regarding medicines.  The following changes have been made:  no change  Labs/ tests ordered today include:   Orders Placed This Encounter  Procedures  . Holter monitor - 48 hour    Disposition:   FU with me in 12 months  Signed, Lauree Chandler, MD 02/12/2016 12:46 PM    [facility], [place], [place]  [address] Phone: [phone]; Fax: [phone]

## 2016-02-12 NOTE — Patient Instructions (Signed)
Medication Instructions:  Your physician recommends that you continue on your current medications as directed. Please refer to the Current Medication list given to you today.   Labwork: none  Testing/Procedures: Your physician has recommended that you wear a holter monitor. Holter monitors are medical devices that record the heart's electrical activity. Doctors most often use these monitors to diagnose arrhythmias. Arrhythmias are problems with the speed or rhythm of the heartbeat. The monitor is a small, portable device. You can wear one while you do your normal daily activities. This is usually used to diagnose what is causing palpitations/syncope (passing out).    Follow-Up: Your physician recommends that you schedule a follow-up appointment in: 12 months.  Please call our office in July or August to schedule this appointment    Any Other Special Instructions Will Be Listed Below (If Applicable).     If you need a refill on your cardiac medications before your next appointment, please call your pharmacy.

## 2016-02-21 ENCOUNTER — Ambulatory Visit (INDEPENDENT_AMBULATORY_CARE_PROVIDER_SITE_OTHER): Payer: BLUE CROSS/BLUE SHIELD

## 2016-02-21 DIAGNOSIS — R002 Palpitations: Secondary | ICD-10-CM

## 2016-02-28 ENCOUNTER — Other Ambulatory Visit: Payer: Self-pay | Admitting: *Deleted

## 2016-02-28 DIAGNOSIS — R0602 Shortness of breath: Secondary | ICD-10-CM

## 2016-03-15 ENCOUNTER — Ambulatory Visit: Payer: Self-pay | Admitting: Cardiovascular Disease

## 2016-04-17 ENCOUNTER — Ambulatory Visit (INDEPENDENT_AMBULATORY_CARE_PROVIDER_SITE_OTHER): Payer: BLUE CROSS/BLUE SHIELD | Admitting: Emergency Medicine

## 2016-04-17 ENCOUNTER — Ambulatory Visit (INDEPENDENT_AMBULATORY_CARE_PROVIDER_SITE_OTHER)
Admission: RE | Admit: 2016-04-17 | Discharge: 2016-04-17 | Disposition: A | Discharge status: Home / Self Care | Payer: BLUE CROSS/BLUE SHIELD | Source: Ambulatory Visit | Attending: Emergency Medicine | Admitting: Emergency Medicine

## 2016-04-17 ENCOUNTER — Encounter: Payer: Self-pay | Admitting: Emergency Medicine

## 2016-04-17 VITALS — BP 124/84 | HR 100 | Ht 63.0 in | Wt 169.0 lb

## 2016-04-17 DIAGNOSIS — R0602 Shortness of breath: Secondary | ICD-10-CM

## 2016-04-17 NOTE — Assessment & Plan Note (Signed)
Based on her description and the need for her to take an intermittent deep inspiration I suspect that she is experiencing transient atelectasis to restriction. Most likely etiology would be weight gain. The sensation resolves immediately after she sighs or takes a deep breath. She has had some symptoms consistent with bronchospasm when she has an upper respiratory infection, but no other times. I believe she needs pulmonary function testing and a chest x-ray to start the evaluation. Depending on these results we may decide to do further pulmonary function evaluation (methacholine), depending on the chest x-ray we may pursue a CT scan. May also need a cardiopulmonary exercise test depending on where the workup leads. There is no current evidence for neuromuscular weakness

## 2016-04-17 NOTE — Progress Notes (Signed)
Subjective:    Patient ID: Carol [name], female    DOB: May 29, 1960, 56 y.o.   MRN: [id number]  HPI Carol [name] is a very pleasant 56 year old never smoker, no hx asthma, who has been seen by [facility] for palpitations in the past. She was then evaluated there more recently by Dr Glennie Hawk for resting and exertional dyspnea in absence of any chest pain. Her beta blocker was changed to Cardizem but her dyspnea continued. An echocardiogram was performed that overall normal function but with some mild mitral and tricuspid regurgitation. No clear etiology for her shortness of breath was identified thus far. She is referred for further evaluation.   She describes dyspnea that dates back to 2008, feels like she needs to take a large sigh to fill up her lungs. She is not very active, no regular exercise. Not associated with pain, stridor, wheeze. No cough. She still had some palpatations on metoprolol, now on diltiazem and less fatigue. Palpitations are as well controlled - still has some breakthrough. Her wt has been stable for last 2-3 yrs, little variability. No other real triggers noted - can sometimes be noticeable when eating.   Notes that she has been to Urgent Care for colds before, has been asked about wheezing. Shes never heard wheeze under any other conditions.    Review of Systems  Constitutional: Negative for fever and unexpected weight change.  HENT: Negative for congestion, dental problem, ear pain, nosebleeds, postnasal drip, rhinorrhea, sinus pressure, sneezing, sore throat and trouble swallowing.   Eyes: Negative for redness and itching.  Respiratory: Positive for shortness of breath. Negative for cough, chest tightness and wheezing.   Cardiovascular: Positive for palpitations. Negative for leg swelling.  Gastrointestinal: Negative for nausea and vomiting.  Genitourinary: Negative for dysuria.  Musculoskeletal: Negative for joint swelling.  Skin: Negative for rash.    Neurological: Negative for headaches.  Hematological: Does not bruise/bleed easily.  Psychiatric/Behavioral: Negative for dysphoric mood. The patient is not nervous/anxious.    Past Medical History:  Diagnosis Date  . Arrhythmia    tachycardia  &palpitations  . Benign neoplasm of colon    colon polyp     Family History  Problem Relation Age of Onset  . Lung cancer Father   . CAD Neg Hx      Social History   Social History  . Marital status: Married    Spouse name: N/A  . Number of children: 2  . Years of education: N/A   Occupational History  . 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 Smoking status: Never Smoker  . Smokeless tobacco: Never Used  . Alcohol use No  . Drug use: No  . Sexual activity: Not on file   Other Topics Concern  . Not on file   Social History Narrative   The patient is married. 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 She does not smoke cigarettes and never has. She occasionally drinks alcohol.  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 [place] native No travel, no military, no mold exposures.  She has a puppy, never birds.   No Known Allergies   Outpatient Medications Prior to Visit  Medication Sig Dispense Refill  . diltiazem (CARDIZEM CD) 120 MG 24 hr capsule Take 1 capsule (120 mg total) by mouth daily. 90 capsule 3   No facility-administered medications prior to visit.  Objective:   Physical Exam Vitals:   04/17/16 1609  BP: 124/84  Pulse: 100  SpO2: 96%  Weight: 169 lb (76.7 kg)  Height: 5\' 3"  (1.6 m)   Gen: Pleasant, well-nourished, in no distress,  normal affect  ENT: No lesions,  mouth clear,  oropharynx clear, no postnasal drip  Neck: No JVD, no TMG, no carotid bruits  Lungs: No use of accessory muscles, no dullness to percussion, clear without rales or rhonchi  Cardiovascular: RRR, heart sounds normal, no murmur or gallops, no  peripheral edema  Musculoskeletal: No deformities, no cyanosis or clubbing  Neuro: alert, non focal  Skin: Warm, no lesions or rashes   TTE 01/23/16 --   - Left ventricle: The cavity size was normal. Wall thickness was   normal. Systolic function was normal. The estimated ejection   fraction was in the range of 55% to 60%. Wall motion was normal;   there were no regional wall motion abnormalities. Left   ventricular diastolic function parameters were normal. - Aortic valve: There was trivial regurgitation. - Mitral valve: There was mild regurgitation. - Tricuspid valve: There was moderate regurgitation.  Impressions:  - Normal LV systolic and diastolic function; trace AI; mild MR;   moderate TR      Assessment & Plan:  DYSPNEA Based on her description and the need for her to take an intermittent deep inspiration I suspect that she is experiencing transient atelectasis to restriction. Most likely etiology would be weight gain. The sensation resolves immediately after she sighs or takes a deep breath. She has had some symptoms consistent with bronchospasm when she has an upper respiratory infection, but no other times. I believe she needs pulmonary function testing and a chest x-ray to start the evaluation. Depending on these results we may decide to do further pulmonary function evaluation (methacholine), depending on the chest x-ray we may pursue a CT scan. May also need a cardiopulmonary exercise test depending on where the workup leads. There is no current evidence for neuromuscular weakness  Baltazar Apo, MD, PhD 04/17/2016, 4:52 PM [facility] [phone] or if no answer [phone]

## 2016-04-17 NOTE — Patient Instructions (Addendum)
We will perform pulmonary function testing We will perform a chest x-ray Follow with Dr Lamonte Sakai asked available after your testing to review together

## 2016-04-18 NOTE — Telephone Encounter (Signed)
Ria Comment   Please see patient email   Colbert Coyer Hoopes  to Collene Gobble, MD       10:29 AM  Ria Comment,  Thank you for taking care of that for me. It was nice meeting you and Dr. Lamonte Sakai yesterday! Enjoy your day as well!  Lind Guest

## 2016-04-30 ENCOUNTER — Telehealth: Payer: Self-pay | Admitting: Emergency Medicine

## 2016-04-30 ENCOUNTER — Encounter: Payer: Self-pay | Admitting: Emergency Medicine

## 2016-04-30 DIAGNOSIS — R911 Solitary pulmonary nodule: Secondary | ICD-10-CM

## 2016-04-30 NOTE — Telephone Encounter (Signed)
Received the following message through MyChart:  Dr. Lamonte Sakai,  I received the chest x-ray results through the my chart app. I am concerned that I read a nodule is not excluded and a CT scan is recommended. Is that something I need to schedule ? Also is this a procedure you will do ? Will I still have the pulmonary functions test done on March 12th? What does gentle dextrocurvature in the lower spine thoracic spine mean?  Sorry for all the questions but it has been on my mind since I received the results.  Thank you for your time.  Carol [name]  --------------------------------------------------------------------------------- RB please advise. Thanks.

## 2016-04-30 NOTE — Telephone Encounter (Signed)
Please let her know that I would recommend that we go ahead and check a CT chest without contrast to eval for possible nodule.   She should go ahead and get the PFT as planned.   dextrocurvature means that she has some slight rightward curve to her lumbar (lower) spine.

## 2016-05-01 NOTE — Telephone Encounter (Signed)
Spoke with pt, reviewed below recs with her.  Order for ct chest placed.  Nothing further needed at this time.

## 2016-05-08 ENCOUNTER — Ambulatory Visit (INDEPENDENT_AMBULATORY_CARE_PROVIDER_SITE_OTHER)
Admission: RE | Admit: 2016-05-08 | Discharge: 2016-05-08 | Disposition: A | Discharge status: Home / Self Care | Payer: BLUE CROSS/BLUE SHIELD | Source: Ambulatory Visit | Attending: Emergency Medicine | Admitting: Emergency Medicine

## 2016-05-08 DIAGNOSIS — R911 Solitary pulmonary nodule: Secondary | ICD-10-CM | POA: Diagnosis not present

## 2016-05-09 ENCOUNTER — Telehealth: Payer: Self-pay | Admitting: Emergency Medicine

## 2016-05-09 ENCOUNTER — Encounter: Payer: Self-pay | Admitting: Emergency Medicine

## 2016-05-09 NOTE — Telephone Encounter (Signed)
Received a MyChart message from the pt. She is requesting results from the CT she had done yesterday.  RB - please advise. Thanks.

## 2016-05-10 NOTE — Telephone Encounter (Signed)
Discussed CT with the pt. The area where a nodule was suspected is clear. There is another small peripheral rounded LLL opacity with central clearing present - ? Whether this is vascular structure vs cavitary lesion. We will plan to follow it in 6 months to insure interval stability.

## 2016-05-21 ENCOUNTER — Ambulatory Visit: Payer: BLUE CROSS/BLUE SHIELD

## 2016-06-17 ENCOUNTER — Ambulatory Visit: Payer: BLUE CROSS/BLUE SHIELD | Admitting: Pulmonary Disease

## 2016-06-17 ENCOUNTER — Ambulatory Visit: Payer: BLUE CROSS/BLUE SHIELD | Admitting: Emergency Medicine

## 2016-06-19 ENCOUNTER — Ambulatory Visit (INDEPENDENT_AMBULATORY_CARE_PROVIDER_SITE_OTHER): Payer: BLUE CROSS/BLUE SHIELD | Admitting: Emergency Medicine

## 2016-06-19 DIAGNOSIS — R0602 Shortness of breath: Secondary | ICD-10-CM

## 2016-06-19 LAB — PULMONARY FUNCTION TEST
DL/VA % pred: 116 %
DL/VA: 5.45 ml/min/mmHg/L
DLCO COR % PRED: 106 %
DLCO UNC: 25.04 ml/min/mmHg
DLCO cor: 24.32 ml/min/mmHg
DLCO unc % pred: 109 %
FEF 25-75 POST: 3.41 L/s
FEF 25-75 PRE: 3.76 L/s
FEF2575-%Change-Post: -9 %
FEF2575-%PRED-PRE: 155 %
FEF2575-%Pred-Post: 140 %
FEV1-%CHANGE-POST: -1 %
FEV1-%PRED-POST: 110 %
FEV1-%Pred-Pre: 111 %
FEV1-POST: 2.8 L
FEV1-Pre: 2.84 L
FEV1FVC-%Change-Post: 0 %
FEV1FVC-%Pred-Pre: 109 %
FEV6-%Change-Post: -2 %
FEV6-%PRED-PRE: 103 %
FEV6-%Pred-Post: 100 %
FEV6-POST: 3.18 L
FEV6-PRE: 3.27 L
FEV6FVC-%PRED-POST: 103 %
FEV6FVC-%Pred-Pre: 103 %
FVC-%CHANGE-POST: -1 %
FVC-%PRED-PRE: 100 %
FVC-%Pred-Post: 98 %
FVC-POST: 3.21 L
FVC-PRE: 3.27 L
POST FEV6/FVC RATIO: 100 %
PRE FEV1/FVC RATIO: 87 %
Post FEV1/FVC ratio: 87 %
Pre FEV6/FVC Ratio: 100 %
RV % pred: 96 %
RV: 1.82 L
TLC % PRED: 103 %
TLC: 5.09 L

## 2016-06-19 NOTE — Progress Notes (Signed)
PFT done today. 

## 2016-07-19 ENCOUNTER — Encounter: Payer: Self-pay | Admitting: Emergency Medicine

## 2016-07-19 ENCOUNTER — Ambulatory Visit (INDEPENDENT_AMBULATORY_CARE_PROVIDER_SITE_OTHER): Payer: BLUE CROSS/BLUE SHIELD | Admitting: Emergency Medicine

## 2016-07-19 DIAGNOSIS — R911 Solitary pulmonary nodule: Secondary | ICD-10-CM | POA: Diagnosis not present

## 2016-07-19 DIAGNOSIS — R0602 Shortness of breath: Secondary | ICD-10-CM | POA: Diagnosis not present

## 2016-07-19 NOTE — Assessment & Plan Note (Signed)
Cardiac evaluation and now pulmonary function testing, CT scan of the chest are all reassuring. I suspect that her dyspnea is due to deconditioning. Some contribution of restrictive lung disease. I have encouraged her to continue exercise and weight loss. If not improved, remains symptomatic and we will explore a cardiopulmonary exercise test or other workup.

## 2016-07-19 NOTE — Addendum Note (Signed)
Addended by: Jannette Spanner on: 07/19/2016 02:01 PM   Modules accepted: Orders

## 2016-07-19 NOTE — Progress Notes (Signed)
Subjective:    Patient ID: Carol [name], female    DOB: Mar 02, 1961, 56 y.o.   MRN: [id number]  HPI Carol [name] is a very pleasant 56 year old never smoker, no hx asthma, who has been seen by [facility] for palpitations in the past. She was then evaluated there more recently by Dr Glennie Hawk for resting and exertional dyspnea in absence of any chest pain. Her beta blocker was changed to Cardizem but her dyspnea continued. An echocardiogram was performed that overall normal function but with some mild mitral and tricuspid regurgitation. No clear etiology for her shortness of breath was identified thus far. She is referred for further evaluation.   She describes dyspnea that dates back to 2008, feels like she needs to take a large sigh to fill up her lungs. She is not very active, no regular exercise. Not associated with pain, stridor, wheeze. No cough. She still had some palpatations on metoprolol, now on diltiazem and less fatigue. Palpitations are as well controlled - still has some breakthrough. Her wt has been stable for last 2-3 yrs, little variability. No other real triggers noted - can sometimes be noticeable when eating.   Notes that she has been to Urgent Care for colds before, has been asked about wheezing. Shes never heard wheeze under any other conditions.   ROV 07/19/16 -- This is a follow-up visit for unexplained dyspnea. She has had a reassuring cardiac evaluation as detailed above. We performed a chest x-ray on 04/17/16 to evaluate. This showed a question of possible interstitial prominence so a CT scan of the chest was done on 05/08/16. Personally reviewed. There was no evidence of interstitial lung disease. There was a small 6 mm nodule in the left lower lobe of unclear significance. This has the appearance of a possible vascular structure. A function testing was done on 06/19/16 and I have personally reviewed. These are normal-normal spirometry, lung volumes, diffusion capacity. She  continues to have exertional dyspnea - no worse or better. Happens w eating, sitting up. Has to sigh to get an adequate breath. She has lost 6 lbs. She is walking.    Review of Systems  Constitutional: Negative for fever and unexpected weight change.  HENT: Negative for congestion, dental problem, ear pain, nosebleeds, postnasal drip, rhinorrhea, sinus pressure, sneezing, sore throat and trouble swallowing.   Eyes: Negative for redness and itching.  Respiratory: Positive for shortness of breath. Negative for cough, chest tightness and wheezing.   Cardiovascular: Positive for palpitations. Negative for leg swelling.  Gastrointestinal: Negative for nausea and vomiting.  Genitourinary: Negative for dysuria.  Musculoskeletal: Negative for joint swelling.  Skin: Negative for rash.  Neurological: Negative for headaches.  Hematological: Does not bruise/bleed easily.  Psychiatric/Behavioral: Negative for dysphoric mood. The patient is not nervous/anxious.    Past Medical History:  Diagnosis Date  . Arrhythmia    tachycardia  &palpitations  . Benign neoplasm of colon    colon polyp     Family History  Problem Relation Age of Onset  . Lung cancer Father   . CAD Neg Hx      Social History   Social History  . Marital status: Married    Spouse name: N/A  . Number of children: 2  . Years of education: N/A   Occupational History  . 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 Smoking status: Never Smoker  . Smokeless tobacco: Never Used  . Alcohol use No  .  Drug use: No  . Sexual activity: Not on file   Other Topics Concern  . Not on file   Social History Narrative   The patient is married. 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 She does not smoke cigarettes and never has. She occasionally drinks alcohol.  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 [place] native No travel, no military, no mold exposures.  She  has a puppy, never birds.   No Known Allergies   Outpatient Medications Prior to Visit  Medication Sig Dispense Refill  . diltiazem (CARDIZEM CD) 120 MG 24 hr capsule Take 1 capsule (120 mg total) by mouth daily. 90 capsule 3   No facility-administered medications prior to visit.         Objective:   Physical Exam Vitals:   07/19/16 1326  BP: 126/90  Pulse: (!) 112  SpO2: 97%  Weight: 163 lb 3.2 oz (74 kg)  Height: 5\' 3"  (1.6 m)   Gen: Pleasant, well-nourished, in no distress,  normal affect  ENT: No lesions,  mouth clear,  oropharynx clear, no postnasal drip  Neck: No JVD, no TMG, no carotid bruits  Lungs: No use of accessory muscles, no dullness to percussion, clear without rales or rhonchi  Cardiovascular: RRR, heart sounds normal, no murmur or gallops, no peripheral edema  Musculoskeletal: No deformities, no cyanosis or clubbing  Neuro: alert, non focal  Skin: Warm, no lesions or rashes   TTE 01/23/16 --   - Left ventricle: The cavity size was normal. Wall thickness was   normal. Systolic function was normal. The estimated ejection   fraction was in the range of 55% to 60%. Wall motion was normal;   there were no regional wall motion abnormalities. Left   ventricular diastolic function parameters were normal. - Aortic valve: There was trivial regurgitation. - Mitral valve: There was mild regurgitation. - Tricuspid valve: There was moderate regurgitation.  Impressions:  - Normal LV systolic and diastolic function; trace AI; mild MR;   moderate TR      Assessment & Plan:  DYSPNEA Cardiac evaluation and now pulmonary function testing, CT scan of the chest are all reassuring. I suspect that her dyspnea is due to deconditioning. Some contribution of restrictive lung disease. I have encouraged her to continue exercise and weight loss. If not improved, remains symptomatic and we will explore a cardiopulmonary exercise test or other workup.  Solitary  pulmonary nodule 6 mm rounded nodule, question vascular structure, and a low risk patient. We will repeat her CT in 6 months.  Baltazar Apo, MD, PhD 07/19/2016, 1:52 PM [facility] [phone] or if no answer [phone]

## 2016-07-19 NOTE — Assessment & Plan Note (Signed)
6 mm rounded nodule, question vascular structure, and a low risk patient. We will repeat her CT in 6 months.

## 2016-07-19 NOTE — Patient Instructions (Addendum)
Your breathing testing is normal. I suspect that your shortness of breath is related to some deconditioning.  Continue to work on your exercise and weight loss.  We will repeat your CT scan of the chest in July 2018 without contrast to follow your small pulmonary nodule.  Follow with Dr Lamonte Sakai in July after your CT chest to review the results.

## 2016-07-31 ENCOUNTER — Encounter: Payer: Self-pay | Admitting: Emergency Medicine

## 2016-10-08 ENCOUNTER — Ambulatory Visit (INDEPENDENT_AMBULATORY_CARE_PROVIDER_SITE_OTHER)
Admission: RE | Admit: 2016-10-08 | Discharge: 2016-10-08 | Disposition: A | Discharge status: Home / Self Care | Payer: BLUE CROSS/BLUE SHIELD | Source: Ambulatory Visit | Attending: Emergency Medicine | Admitting: Emergency Medicine

## 2016-10-08 DIAGNOSIS — R911 Solitary pulmonary nodule: Secondary | ICD-10-CM | POA: Diagnosis not present

## 2016-10-08 DIAGNOSIS — R0602 Shortness of breath: Secondary | ICD-10-CM

## 2016-10-15 ENCOUNTER — Encounter: Payer: Self-pay | Admitting: Emergency Medicine

## 2016-10-15 ENCOUNTER — Ambulatory Visit (INDEPENDENT_AMBULATORY_CARE_PROVIDER_SITE_OTHER): Payer: BLUE CROSS/BLUE SHIELD | Admitting: Emergency Medicine

## 2016-10-15 DIAGNOSIS — R0602 Shortness of breath: Secondary | ICD-10-CM | POA: Diagnosis not present

## 2016-10-15 DIAGNOSIS — R911 Solitary pulmonary nodule: Secondary | ICD-10-CM | POA: Diagnosis not present

## 2016-10-15 NOTE — Assessment & Plan Note (Signed)
With reassuring cardiac and pulmonary evaluations thus far. This is deconditioning but if her dyspnea does not improve with exercise and better conditioning then we should reconsider a cardio pulmonary exercise test. She will contact me if she believes this deserves further investigation.

## 2016-10-15 NOTE — Patient Instructions (Signed)
We should repeat your CT scan of the chest in January of 2020 to compare with priors. Please follow up with Dr Lamonte Sakai if your shortness of breath progresses or if you develop any new breathing symptoms (cough, pain, wheezing, etc). If so then we may decide to perform testing sooner.  Follow with Dr Lamonte Sakai in 04/2018 after your scan to review together.

## 2016-10-15 NOTE — Progress Notes (Signed)
Subjective:    Patient ID: Carol [name], female    DOB: 04-Dec-1960, 56 y.o.   MRN: [id number]  HPI Carol [name] is a very pleasant 57 year old never smoker, no hx asthma, who has been seen by [facility] for palpitations in the past. She was then evaluated there more recently by Dr Glennie Hawk for resting and exertional dyspnea in absence of any chest pain. Her beta blocker was changed to Cardizem but her dyspnea continued. An echocardiogram was performed that overall normal function but with some mild mitral and tricuspid regurgitation. No clear etiology for her shortness of breath was identified thus far. She is referred for further evaluation.   She describes dyspnea that dates back to 2008, feels like she needs to take a large sigh to fill up her lungs. She is not very active, no regular exercise. Not associated with pain, stridor, wheeze. No cough. She still had some palpatations on metoprolol, now on diltiazem and less fatigue. Palpitations are as well controlled - still has some breakthrough. Her wt has been stable for last 2-3 yrs, little variability. No other real triggers noted - can sometimes be noticeable when eating.   Notes that she has been to Urgent Care for colds before, has been asked about wheezing. Shes never heard wheeze under any other conditions.   ROV 07/19/16 -- This is a follow-up visit for unexplained dyspnea. She has had a reassuring cardiac evaluation as detailed above. We performed a chest x-ray on 04/17/16 to evaluate. This showed a question of possible interstitial prominence so a CT scan of the chest was done on 05/08/16. Personally reviewed. There was no evidence of interstitial lung disease. There was a small 6 mm nodule in the left lower lobe of unclear significance. This has the appearance of a possible vascular structure. A function testing was done on 06/19/16 and I have personally reviewed. These are normal-normal spirometry, lung volumes, diffusion capacity. She  continues to have exertional dyspnea - no worse or better. Happens w eating, sitting up. Has to sigh to get an adequate breath. She has lost 6 lbs. She is walking.   ROV 10/15/16 -- Carol [name] is a never smoker, has a history of exertional dyspnea. She has had reassuring cardiac and pulmonary evaluations thus far and I have ascribed much of her shortness of breath to deconditioning. We have also been following a 6 mm rounded nodule in the left lower lobe, several scattered calcified granulomas. She underwent repeat CT scan of the chest on 10/08/16 that I have personally reviewed. This shows that her left lower lobe cavitary nodule is stable in size, 5.4 mm. Her calcified scattered granulomas are also unchanged.    Review of Systems  Constitutional: Negative for fever and unexpected weight change.  HENT: Negative for congestion, dental problem, ear pain, nosebleeds, postnasal drip, rhinorrhea, sinus pressure, sneezing, sore throat and trouble swallowing.   Eyes: Negative for redness and itching.  Respiratory: Positive for shortness of breath. Negative for cough, chest tightness and wheezing.   Cardiovascular: Positive for palpitations. Negative for leg swelling.  Gastrointestinal: Negative for nausea and vomiting.  Genitourinary: Negative for dysuria.  Musculoskeletal: Negative for joint swelling.  Skin: Negative for rash.  Neurological: Negative for headaches.  Hematological: Does not bruise/bleed easily.  Psychiatric/Behavioral: Negative for dysphoric mood. The patient is not nervous/anxious.     Past Medical History:  Diagnosis Date  . Arrhythmia    tachycardia  &palpitations  . Benign neoplasm of colon  colon polyp     Family History  Problem Relation Age of Onset  . Lung cancer Father   . CAD Neg Hx      Social History   Social History  . Marital status: Married    Spouse name: N/A  . Number of children: 2  . Years of education: N/A   Occupational History  . 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 Smoking status: Never Smoker  . Smokeless tobacco: Never Used  . Alcohol use No  . Drug use: No  . Sexual activity: Not on file   Other Topics Concern  . Not on file   Social History Narrative   The patient is married. 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 She does not smoke cigarettes and never has. She occasionally drinks alcohol.  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 [place] native No travel, no military, no mold exposures.  She has a puppy, never birds.   No Known Allergies   Outpatient Medications Prior to Visit  Medication Sig Dispense Refill  . diltiazem (CARDIZEM CD) 120 MG 24 hr capsule Take 1 capsule (120 mg total) by mouth daily. 90 capsule 3   No facility-administered medications prior to visit.         Objective:   Physical Exam Vitals:   10/15/16 1428  BP: 126/86  Pulse: 100  SpO2: 98%  Weight: 162 lb (73.5 kg)  Height: 5\' 3"  (1.6 m)   Gen: Pleasant, well-nourished, in no distress,  normal affect  ENT: No lesions,  mouth clear,  oropharynx clear, no postnasal drip  Neck: No JVD, no TMG, no carotid bruits  Lungs: No use of accessory muscles, clear without rales or rhonchi  Cardiovascular: RRR, heart sounds normal, no murmur or gallops, no peripheral edema  Musculoskeletal: No deformities, no cyanosis or clubbing  Neuro: alert, non focal  Skin: Warm, no lesions or rashes    Ct chest 10/08/16 --  COMPARISON:  05/08/2016  FINDINGS: Cardiovascular: Minimal atherosclerotic calcification aortic arch. Upper normal caliber ascending thoracic aorta 3.6 cm diameter. No pericardial effusion.  Mediastinum/Nodes: Scattered normal sized superior mediastinal prevascular lymph nodes. No thoracic adenopathy. Question 11 mm RIGHT thyroid nodule. Esophagus unremarkable.  Lungs/Pleura: Cavitary 5.4 mm peripheral LEFT lower lobe nodule image  104 unchanged. Calcified granuloma RIGHT lower lobe image 80 unchanged. Calcified nodule at superior aspect RIGHT major fissure image 52 unchanged. Tiny RIGHT middle lobe nodule image 87 unchanged. No new mass or nodule. No pulmonary infiltrate, pleural effusion or pneumothorax.  Upper Abdomen: Cyst lateral segment LEFT lobe liver 2.2 x 2.3 cm image 127 not significantly changed. Remaining visualized upper abdomen unremarkable.  Musculoskeletal: No acute osseous findings.  IMPRESSION: Stable 5.4 mm LEFT lower lobe cavitary nodule.  Additional tiny stable RIGHT lung nodules.     TTE 01/23/16 --   - Left ventricle: The cavity size was normal. Wall thickness was   normal. Systolic function was normal. The estimated ejection   fraction was in the range of 55% to 60%. Wall motion was normal;   there were no regional wall motion abnormalities. Left   ventricular diastolic function parameters were normal. - Aortic valve: There was trivial regurgitation. - Mitral valve: There was mild regurgitation. - Tricuspid valve: There was moderate regurgitation.  Impressions:  - Normal LV systolic and diastolic function; trace AI; mild MR;   moderate TR  Assessment & Plan:  Solitary pulmonary nodule Small rounded cavitary left lower lobe nodule is stable in size after 6 months. Because she is low risk a believe we can repeat a scan in 18 months. Certainly if she develops symptoms, cough, pain, progressive dyspnea then we could discuss doing so sooner.  DYSPNEA With reassuring cardiac and pulmonary evaluations thus far. This is deconditioning but if her dyspnea does not improve with exercise and better conditioning then we should reconsider a cardio pulmonary exercise test. She will contact me if she believes this deserves further investigation.  Baltazar Apo, MD, PhD 10/15/2016, 2:58 PM [facility] [phone] or if no answer [phone]

## 2016-10-15 NOTE — Assessment & Plan Note (Signed)
Small rounded cavitary left lower lobe nodule is stable in size after 6 months. Because she is low risk a believe we can repeat a scan in 18 months. Certainly if she develops symptoms, cough, pain, progressive dyspnea then we could discuss doing so sooner.

## 2016-12-31 ENCOUNTER — Encounter: Payer: Self-pay | Admitting: Cardiovascular Disease

## 2017-01-01 MED ORDER — DILTIAZEM HCL ER COATED BEADS 120 MG PO CP24: 120.0000 mg | ORAL_CAPSULE | Freq: Every day | ORAL | 1 refills | Status: DC

## 2017-05-21 ENCOUNTER — Encounter: Payer: Self-pay | Admitting: Cardiovascular Disease

## 2017-05-21 ENCOUNTER — Ambulatory Visit: Payer: BLUE CROSS/BLUE SHIELD | Admitting: Cardiovascular Disease

## 2017-05-21 VITALS — BP 112/86 | HR 82 | Ht 63.0 in | Wt 168.0 lb

## 2017-05-21 DIAGNOSIS — I059 Rheumatic mitral valve disease, unspecified: Secondary | ICD-10-CM

## 2017-05-21 DIAGNOSIS — R002 Palpitations: Secondary | ICD-10-CM

## 2017-05-21 MED ORDER — DILTIAZEM HCL ER COATED BEADS 120 MG PO CP24: 120.0000 mg | ORAL_CAPSULE | Freq: Every day | ORAL | 3 refills | Status: DC

## 2017-05-21 NOTE — Progress Notes (Signed)
Chief Complaint  Patient presents with  . Follow-up    PACs    History of Present Illness: 57 yo female with history of palpitations who is here today for follow up. I saw her as a new patient for evaluation of dyspnea 01/11/16. She has been followed in the past by Dr. Aldona Bar and Dr. Percival Spanish for palpitations. She last saw Dr. Percival Spanish in 2012. She has been treated with beta blocker therapy. Remote cardiac monitor without arrhythmias. She described dyspnea at rest and with exertion but no chest pain. Echo 01/23/16 with normal LV systolic function, trivial AI, trivial MI, moderate TR. Thyroid studies ok in primary care. Cardiac monitor November 2017 with sinus rhythm, lowest HR 54 bpm, rare PACs, PVCs. No SVT or VT noted.   She is here today for follow up. The patient denies any chest pain, dyspnea, palpitations, lower extremity edema, orthopnea, PND, dizziness, near syncope or syncope.   Primary Care Physician: Vania Rea, MD  Past Medical History:  Diagnosis Date  . Arrhythmia    tachycardia  &palpitations  . Benign neoplasm of colon    colon polyp    Past Surgical History:  Procedure Laterality Date  . None      Current Outpatient Medications  Medication Sig Dispense Refill  . diltiazem (CARDIZEM CD) 120 MG 24 hr capsule Take 1 capsule (120 mg total) by mouth daily. 90 capsule 3   No current facility-administered medications for this visit.     No Known Allergies  Social History   Socioeconomic History  . Marital status: Married    Spouse name: Not on file  . Number of children: 2  . Years of education: Not on file  . Highest education level: Not on file  Social Needs  . Financial resource strain: Not on file  . Food insecurity - worry: Not on file  . Food insecurity - inability: Not on file  . Transportation needs - medical: Not on file  . Transportation needs - non-medical: Not on file  Occupational History  . Occupation: Self employed Designer, television/film set   Tobacco Use  . Smoking status: Never Smoker  . Smokeless tobacco: Never Used  Substance and Sexual Activity  . Alcohol use: No  . Drug use: No  . Sexual activity: Not on file  Other Topics Concern  . Not on file  Social History Narrative   The patient is married. 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 She does not smoke cigarettes and never has. She occasionally drinks alcohol.    Family History  Problem Relation Age of Onset  . Lung cancer Father   . CAD Neg Hx     Review of Systems:  As stated in the HPI and otherwise negative.   BP 112/86   Pulse 82   Ht 5\' 3"  (1.6 m)   Wt 168 lb (76.2 kg)   SpO2 95%   BMI 29.76 kg/m   Physical Examination:  General: Well developed, well nourished, NAD  HEENT: OP clear, mucus membranes moist  SKIN: warm, dry. No rashes. Neuro: No focal deficits  Musculoskeletal: Muscle strength 5/5 all ext  Psychiatric: Mood and affect normal  Neck: No JVD, no carotid bruits, no thyromegaly, no lymphadenopathy.  Lungs:Clear bilaterally, no wheezes, rhonci, crackles Cardiovascular: Regular rate and rhythm. No murmurs, gallops or rubs. Abdomen:Soft. Bowel sounds present. Non-tender.  Extremities: No lower extremity edema. Pulses are 2 + in the bilateral DP/PT.  Echo  01/23/16: Left ventricle: The cavity size was normal. Wall thickness was   normal. Systolic function was normal. The estimated ejection   fraction was in the range of 55% to 60%. Wall motion was normal;   there were no regional wall motion abnormalities. Left   ventricular diastolic function parameters were normal. - Aortic valve: There was trivial regurgitation. - Mitral valve: There was mild regurgitation. - Tricuspid valve: There was moderate regurgitation. Impressions: - Normal LV systolic and diastolic function; trace AI; mild MR;   moderate TR.  EKG:  EKG is ordered today. The ekg ordered today demonstrates NSR, rate 82 bpm. Non-specific ST abn.  unchanged  Recent Labs: No results found for requested labs within last 8760 hours.   Lipid Panel No results found for: CHOL, TRIG, HDL, CHOLHDL, VLDL, LDLCALC, LDLDIRECT   Wt Readings from Last 3 Encounters:  05/21/17 168 lb (76.2 kg)  10/15/16 162 lb (73.5 kg)  07/19/16 163 lb 3.2 oz (74 kg)     Other studies Reviewed: Additional studies/ records that were reviewed today include: . Review of the above records demonstrates:   Assessment and Plan:   1. Palpitations/PACs/PVCs: Cardiac monitor November 2017 with rare PVCs and PACs.  Rare palpitations. Continue Cardizem.   2. Mitral regurgitation:  Mild by echo in October 2017. Repeat echo October 2020.    Current medicines are reviewed at length with the patient today.  The patient does not have concerns regarding medicines.  The following changes have been made:  no change  Labs/ tests ordered today include:   Orders Placed This Encounter  Procedures  . EKG 12-Lead    Disposition:   FU with me in 12 months  Signed, Lauree Chandler, MD 05/21/2017 4:47 PM    [facility] [place], [place], [place]  [address] Phone: [phone]; Fax: [phone]

## 2017-05-21 NOTE — Patient Instructions (Signed)

## 2017-06-20 ENCOUNTER — Encounter: Payer: Self-pay | Admitting: Cardiovascular Disease

## 2017-06-29 ENCOUNTER — Other Ambulatory Visit: Payer: Self-pay | Admitting: Cardiovascular Disease

## 2017-06-30 ENCOUNTER — Other Ambulatory Visit: Payer: Self-pay | Admitting: Cardiovascular Disease

## 2017-06-30 MED ORDER — DILTIAZEM HCL ER COATED BEADS 120 MG PO CP24: 120.0000 mg | ORAL_CAPSULE | Freq: Every day | ORAL | 3 refills | Status: DC

## 2018-03-20 ENCOUNTER — Other Ambulatory Visit: Payer: Self-pay | Admitting: Emergency Medicine

## 2018-03-20 DIAGNOSIS — R918 Other nonspecific abnormal finding of lung field: Secondary | ICD-10-CM

## 2018-04-13 ENCOUNTER — Inpatient Hospital Stay: Admission: RE | Admit: 2018-04-13 | Payer: BLUE CROSS/BLUE SHIELD | Source: Ambulatory Visit

## 2018-04-15 ENCOUNTER — Ambulatory Visit (INDEPENDENT_AMBULATORY_CARE_PROVIDER_SITE_OTHER)
Admission: RE | Admit: 2018-04-15 | Discharge: 2018-04-15 | Disposition: A | Discharge status: Home / Self Care | Payer: BLUE CROSS/BLUE SHIELD | Source: Ambulatory Visit | Attending: Emergency Medicine | Admitting: Emergency Medicine

## 2018-04-15 DIAGNOSIS — R918 Other nonspecific abnormal finding of lung field: Secondary | ICD-10-CM

## 2018-04-21 ENCOUNTER — Ambulatory Visit: Payer: BLUE CROSS/BLUE SHIELD | Admitting: Emergency Medicine

## 2018-04-21 ENCOUNTER — Encounter: Payer: Self-pay | Admitting: Emergency Medicine

## 2018-04-21 DIAGNOSIS — R918 Other nonspecific abnormal finding of lung field: Secondary | ICD-10-CM

## 2018-04-21 NOTE — Assessment & Plan Note (Signed)
Stable 6 mm pulmonary nodule that is rounded with a somewhat cavitary center.  Stable now for 2 years.  I think that this needs to be treated as a sub-solid nodule and therefore followed for longer.  I will repeat the CT scan in 18 months

## 2018-04-21 NOTE — Progress Notes (Signed)
Subjective:    Patient ID: Carol [name], female    DOB: 01/22/1961, 58 y.o.   MRN: [id number]  HPI Carol [name] is a very pleasant 58 year old never smoker, no hx asthma, who has been seen by [facility] for palpitations in the past. She was then evaluated there more recently by Dr Glennie Hawk for resting and exertional dyspnea in absence of any chest pain. Her beta blocker was changed to Cardizem but her dyspnea continued. An echocardiogram was performed that overall normal function but with some mild mitral and tricuspid regurgitation. No clear etiology for her shortness of breath was identified thus far. She is referred for further evaluation.   She describes dyspnea that dates back to 58, feels like she needs to take a large sigh to fill up her lungs. She is not very active, no regular exercise. Not associated with pain, stridor, wheeze. No cough. She still had some palpatations on metoprolol, now on diltiazem and less fatigue. Palpitations are as well controlled - still has some breakthrough. Her wt has been stable for last 2-3 yrs, little variability. No other real triggers noted - can sometimes be noticeable when eating.   Notes that she has been to Urgent Care for colds before, has been asked about wheezing. Shes never heard wheeze under any other conditions.   ROV 07/19/16 -- This is a follow-up visit for unexplained dyspnea. She has had a reassuring cardiac evaluation as detailed above. We performed a chest x-ray on 04/17/16 to evaluate. This showed a question of possible interstitial prominence so a CT scan of the chest was done on 05/08/16. Personally reviewed. There was no evidence of interstitial lung disease. There was a small 6 mm nodule in the left lower lobe of unclear significance. This has the appearance of a possible vascular structure. A function testing was done on 06/19/16 and I have personally reviewed. These are normal-normal spirometry, lung volumes, diffusion capacity. She  continues to have exertional dyspnea - no worse or better. Happens w eating, sitting up. Has to sigh to get an adequate breath. She has lost 6 lbs. She is walking.   ROV 10/15/16 -- Carol [name] is a never smoker, has a history of exertional dyspnea. She has had reassuring cardiac and pulmonary evaluations thus far and I have ascribed much of her shortness of breath to deconditioning. We have also been following a 6 mm rounded nodule in the left lower lobe, several scattered calcified granulomas. She underwent repeat CT scan of the chest on 10/08/16 that I have personally reviewed. This shows that her left lower lobe cavitary nodule is stable in size, 5.4 mm. Her calcified scattered granulomas are also unchanged.   ROV 04/21/18 --Carol [name] is a 58, never smoker, has experienced some exertional dyspnea with reassuring cardiac and pulmonary evaluations.  She has a 6 mm rounded somewhat cavitary left lower lobe nodule and some scattered calcified granulomas that we have followed with CT scanning.  She had a repeat scan done on 04/15/2018 that I reviewed.  This shows that the nodules are unchanged including the 6 mm left lower lobe nodule.  She states that she is doing well, has no complaints.  She is here to talk about strategies for following this nodule.   Review of Systems  Constitutional: Negative for fever and unexpected weight change.  HENT: Negative for congestion, dental problem, ear pain, nosebleeds, postnasal drip, rhinorrhea, sinus pressure, sneezing, sore throat and trouble swallowing.   Eyes: Negative for redness and itching.  Respiratory: Positive for shortness of breath. Negative for cough, chest tightness and wheezing.   Cardiovascular: Positive for palpitations. Negative for leg swelling.  Gastrointestinal: Negative for nausea and vomiting.  Genitourinary: Negative for dysuria.  Musculoskeletal: Negative for joint swelling.  Skin: Negative for rash.  Neurological: Negative for headaches.    Hematological: Does not bruise/bleed easily.  Psychiatric/Behavioral: Negative for dysphoric mood. The patient is not nervous/anxious.     Past Medical History:  Diagnosis Date  . Arrhythmia    tachycardia  &palpitations  . Benign neoplasm of colon    colon polyp     Family History  Problem Relation Age of Onset  . Lung cancer Father   . CAD Neg Hx      Social History   Socioeconomic History  . Marital status: Married    Spouse name: Not on file  . Number of children: 2  . Years of education: Not on file  . Highest education level: Not on file  Occupational History  . Occupation: Self employed Engineer, water   Social Needs  . Financial resource strain: Not on file  . Food insecurity:    Worry: Not on file    Inability: Not on file  . Transportation needs:    Medical: Not on file    Non-medical: Not on file  Tobacco Use  . Smoking status: Never Smoker  . Smokeless tobacco: Never Used  Substance and Sexual Activity  . Alcohol use: No  . Drug use: No  . Sexual activity: Not on file  Lifestyle  . Physical activity:    Days per week: Not on file    Minutes per session: Not on file  . Stress: Not on file  Relationships  . Social connections:    Talks on phone: Not on file    Gets together: Not on file    Attends religious service: Not on file    Active member of club or organization: Not on file    Attends meetings of clubs or organizations: Not on file    Relationship status: Not on file  . Intimate partner violence:    Fear of current or ex partner: Not on file    Emotionally abused: Not on file    Physically abused: Not on file    Forced sexual activity: Not on file  Other Topics Concern  . Not on file  Social History Narrative   The patient is married. 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 She does not smoke cigarettes and never has. She occasionally drinks alcohol.  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 [place] native No travel, no military, no mold exposures.  She has a puppy, never birds.   No Known Allergies   Outpatient Medications Prior to Visit  Medication Sig Dispense Refill  . diltiazem (CARDIZEM CD) 120 MG 24 hr capsule Take 1 capsule (120 mg total) by mouth daily. 90 capsule 3   No facility-administered medications prior to visit.         Objective:   Physical Exam Vitals:   04/21/18 1425  BP: 126/86  Pulse: (!) 102  SpO2: 98%  Weight: 174 lb (78.9 kg)  Height: 5\' 3"  (1.6 m)   Gen: Pleasant, well-nourished, in no distress,  normal affect  ENT: No lesions,  mouth clear,  oropharynx clear, no postnasal drip  Neck: No JVD, no TMG, no carotid bruits  Lungs: No use of accessory muscles, clear without  rales or rhonchi  Cardiovascular: RRR, heart sounds normal, no murmur or gallops, no peripheral edema  Musculoskeletal: No deformities, no cyanosis or clubbing  Neuro: alert, non focal  Skin: Warm, no lesions or rash    TTE 01/23/16 --   - Left ventricle: The cavity size was normal. Wall thickness was   normal. Systolic function was normal. The estimated ejection   fraction was in the range of 55% to 60%. Wall motion was normal;   there were no regional wall motion abnormalities. Left   ventricular diastolic function parameters were normal. - Aortic valve: There was trivial regurgitation. - Mitral valve: There was mild regurgitation. - Tricuspid valve: There was moderate regurgitation.  Impressions:  - Normal LV systolic and diastolic function; trace AI; mild MR;   moderate TR      Assessment & Plan:  Solitary pulmonary nodule Stable 6 mm pulmonary nodule that is rounded with a somewhat cavitary center.  Stable now for 2 years.  I think that this needs to be treated as a sub-solid nodule and therefore followed for longer.  I will repeat the CT scan in 18 months  Baltazar Apo, MD, PhD 04/21/2018, 2:49 PM  Pulmonary and Critical  Care [phone] or if no answer [phone]

## 2018-04-21 NOTE — Patient Instructions (Addendum)
Your pulmonary nodules are stable on your CT scan from 04/2018.  They have been stable for 2 years.  This is good news. We will plan to repeat your CT scan of the chest in 18 months, July 2021 to ensure stability. Please call us if you develop any new symptoms of shortness of breath, cough, chest discomfort Otherwise please follow-up with Dr. Lamonte Sakai after that CT scan to review together.

## 2018-06-01 ENCOUNTER — Encounter: Payer: Self-pay | Admitting: Cardiovascular Disease

## 2018-06-01 ENCOUNTER — Ambulatory Visit: Payer: BLUE CROSS/BLUE SHIELD | Admitting: Cardiovascular Disease

## 2018-06-01 VITALS — BP 118/86 | HR 99 | Ht 63.0 in | Wt 176.4 lb

## 2018-06-01 DIAGNOSIS — I059 Rheumatic mitral valve disease, unspecified: Secondary | ICD-10-CM | POA: Diagnosis not present

## 2018-06-01 DIAGNOSIS — R002 Palpitations: Secondary | ICD-10-CM | POA: Diagnosis not present

## 2018-06-01 MED ORDER — DILTIAZEM HCL ER COATED BEADS 120 MG PO CP24: 120.0000 mg | ORAL_CAPSULE | Freq: Every day | ORAL | 3 refills | Status: DC

## 2018-06-01 NOTE — Patient Instructions (Signed)
Medication Instructions:  Your physician recommends that you continue on your current medications as directed. Please refer to the Current Medication list given to you today.  If you need a refill on your cardiac medications before your next appointment, please call your pharmacy.   Lab work: none If you have labs (blood work) drawn today and your tests are completely normal, you will receive your results only by: Marland Kitchen MyChart Message (if you have MyChart) OR . A paper copy in the mail If you have any lab test that is abnormal or we need to change your treatment, we will call you to review the results.  Testing/Procedures: None   Follow-Up: At [facility], you and your health needs are our priority.  As part of our continuing mission to provide you with exceptional heart care, we have created designated Provider Care Teams.  These Care Teams include your primary Cardiologist (physician) and Advanced Practice Providers (APPs -  Physician Assistants and Nurse Practitioners) who all work together to provide you with the care you need, when you need it. You will need a follow up appointment in 12 months.  Please call our office 2 months in advance to schedule this appointment.  You may see Lauree Chandler, MD or one of the following Advanced Practice Providers on your designated Care Team:   [place], PA-C Melina Copa, PA-C . Ermalinda Barrios, PA-C  Any Other Special Instructions Will Be Listed Below (If Applicable).

## 2018-06-01 NOTE — Progress Notes (Signed)
Chief Complaint  Patient presents with  . Follow-up    PVCs   History of Present Illness: 58 yo female with history of palpitations who is here today for follow up. I saw her as a new patient for evaluation of dyspnea 01/11/16. She has been followed in the past by Dr. Aldona Bar and Dr. Percival Spanish for palpitations. She last saw Dr. Percival Spanish in 2012. She has been treated with beta blocker therapy. Remote cardiac monitor without arrhythmias. She described dyspnea at rest and with exertion but no chest pain. Echo 01/23/16 with normal LV systolic function, trivial AI, trivial MI, moderate TR. Thyroid studies ok in primary care. Cardiac monitor November 2017 with sinus rhythm, lowest HR 54 bpm, rare PACs, PVCs. No SVT or VT noted.   She is here today for follow up. The patient denies any chest pain, dyspnea, palpitations, lower extremity edema, orthopnea, PND, dizziness, near syncope or syncope.   Primary Care Physician: Vania Rea, MD  Past Medical History:  Diagnosis Date  . Arrhythmia    tachycardia  &palpitations  . Benign neoplasm of colon    colon polyp    Past Surgical History:  Procedure Laterality Date  . None      Current Outpatient Medications  Medication Sig Dispense Refill  . diltiazem (CARDIZEM CD) 120 MG 24 hr capsule Take 1 capsule (120 mg total) by mouth daily. 90 capsule 3   No current facility-administered medications for this visit.     No Known Allergies  Social History   Socioeconomic History  . Marital status: Married    Spouse name: Not on file  . Number of children: 2  . Years of education: Not on file  . Highest education level: Not on file  Occupational History  . Occupation: Self employed Engineer, water   Social Needs  . Financial resource strain: Not on file  . Food insecurity:    Worry: Not on file    Inability: Not on file  . Transportation needs:    Medical: Not on file    Non-medical: Not on file  Tobacco Use  . Smoking  status: Never Smoker  . Smokeless tobacco: Never Used  Substance and Sexual Activity  . Alcohol use: No  . Drug use: No  . Sexual activity: Not on file  Lifestyle  . Physical activity:    Days per week: Not on file    Minutes per session: Not on file  . Stress: Not on file  Relationships  . Social connections:    Talks on phone: Not on file    Gets together: Not on file    Attends religious service: Not on file    Active member of club or organization: Not on file    Attends meetings of clubs or organizations: Not on file    Relationship status: Not on file  . Intimate partner violence:    Fear of current or ex partner: Not on file    Emotionally abused: Not on file    Physically abused: Not on file    Forced sexual activity: Not on file  Other Topics Concern  . Not on file  Social History Narrative   The patient is married. 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 She does not smoke cigarettes and never has. She occasionally drinks alcohol.    Family History  Problem Relation Age of Onset  . Lung cancer Father   . CAD Neg Hx  Review of Systems:  As stated in the HPI and otherwise negative.   BP 118/86   Pulse 99   Ht 5\' 3"  (1.6 m)   Wt 176 lb 6.4 oz (80 kg)   SpO2 93%   BMI 31.25 kg/m   Physical Examination:  General: Well developed, well nourished, NAD  HEENT: OP clear, mucus membranes moist  SKIN: warm, dry. No rashes. Neuro: No focal deficits  Musculoskeletal: Muscle strength 5/5 all ext  Psychiatric: Mood and affect normal  Neck: No JVD, no carotid bruits, no thyromegaly, no lymphadenopathy.  Lungs:Clear bilaterally, no wheezes, rhonci, crackles Cardiovascular: Regular rate and rhythm. No murmurs, gallops or rubs. Abdomen:Soft. Bowel sounds present. Non-tender.  Extremities: No lower extremity edema. Pulses are 2 + in the bilateral DP/PT.  Echo 01/23/16: Left ventricle: The cavity size was normal. Wall thickness was   normal. Systolic  function was normal. The estimated ejection   fraction was in the range of 55% to 60%. Wall motion was normal;   there were no regional wall motion abnormalities. Left   ventricular diastolic function parameters were normal. - Aortic valve: There was trivial regurgitation. - Mitral valve: There was mild regurgitation. - Tricuspid valve: There was moderate regurgitation. Impressions: - Normal LV systolic and diastolic function; trace AI; mild MR;   moderate TR.  EKG:  EKG is ordered today. The ekg ordered today demonstrates NSR, rate 99 bpm. Non-specific ST and T wave abn diffusely. Unchanged from last EKG in 2019.   Recent Labs: No results found for requested labs within last 8760 hours.   Lipid Panel No results found for: CHOL, TRIG, HDL, CHOLHDL, VLDL, LDLCALC, LDLDIRECT   Wt Readings from Last 3 Encounters:  06/01/18 176 lb 6.4 oz (80 kg)  04/21/18 174 lb (78.9 kg)  05/21/17 168 lb (76.2 kg)     Other studies Reviewed: Additional studies/ records that were reviewed today include: . Review of the above records demonstrates:   Assessment and Plan:   1. Palpitations/PACs/PVCs: Cardiac monitor November 2017 with rare PVCs and PACs.  She has rare palpitations. Will continue Cardizem.   2. Mitral regurgitation:  Mild by echo in October 2017. Repeat echo in a year. She wants to hold off for now given recent change in insurance ([employer] no longer accepted at [facility]).     Current medicines are reviewed at length with the patient today.  The patient does not have concerns regarding medicines.  The following changes have been made:  no change  Labs/ tests ordered today include:   Orders Placed This Encounter  Procedures  . EKG 12-Lead    Disposition:   FU with me in 12 months  Signed, Lauree Chandler, MD 06/01/2018 4:41 PM    [facility] [place], [place], [place]  [address] Phone: [phone]; Fax: [phone]

## 2018-12-14 IMAGING — CT CT CHEST W/O CM
2 of 3 series · 15 of 36 positions shown, 18 images · non-contrast
Comparison: 04/17/2016

CLINICAL DATA: 55-year-old female with possible left upper lobe
lung nodule on recent chest radiograph. Shortness of breath for 6
months.

EXAM:
CT CHEST WITHOUT CONTRAST
TECHNIQUE: Multidetector CT imaging of the chest was performed following the
standard protocol without IV contrast.

[Series 2: thorax · axial · 0.67mm/px · z∈[-296,-52]mm · 12 of 144 slices shown, 15 images]
[im 11/144  mediastinal]
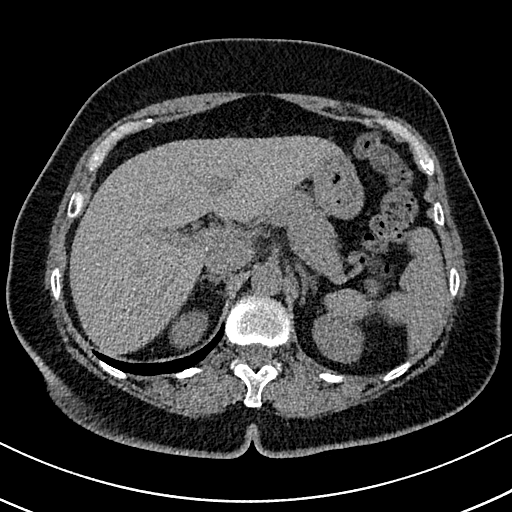
[im 11/144  lung]
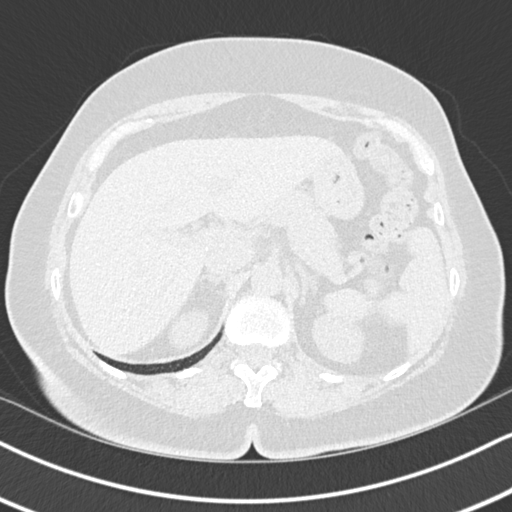
[im 22/144  lung]
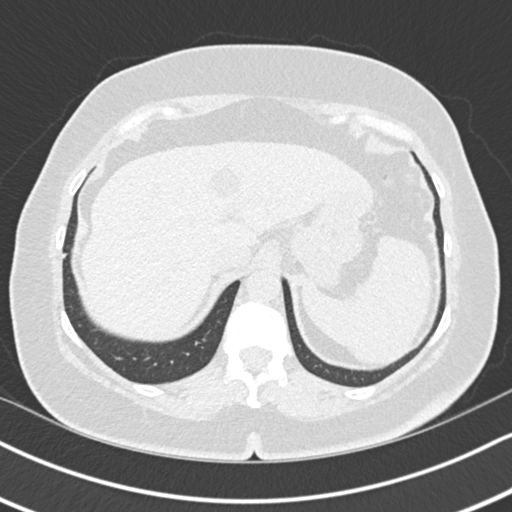
[im 32/144  lung]
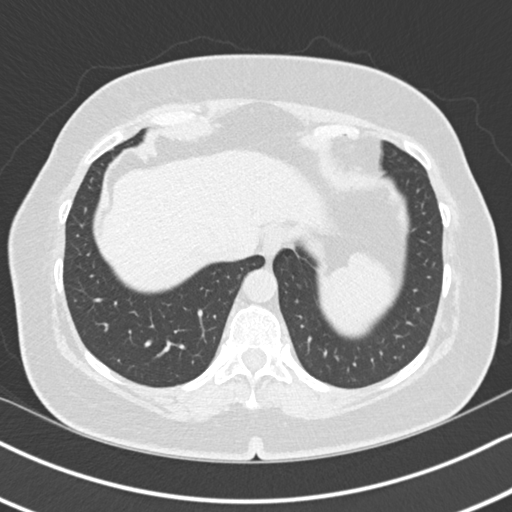
[im 43/144  lung]
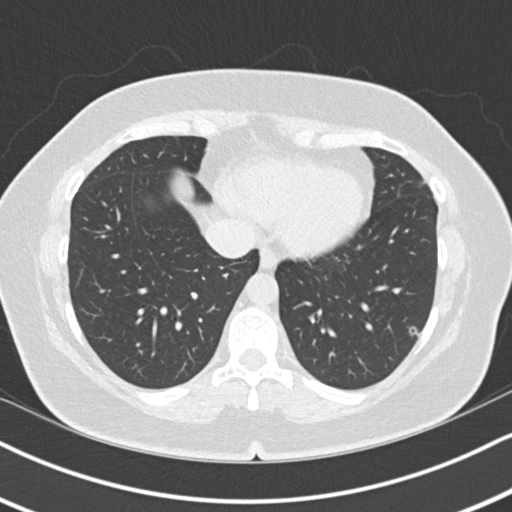
[im 53/144  mediastinal]
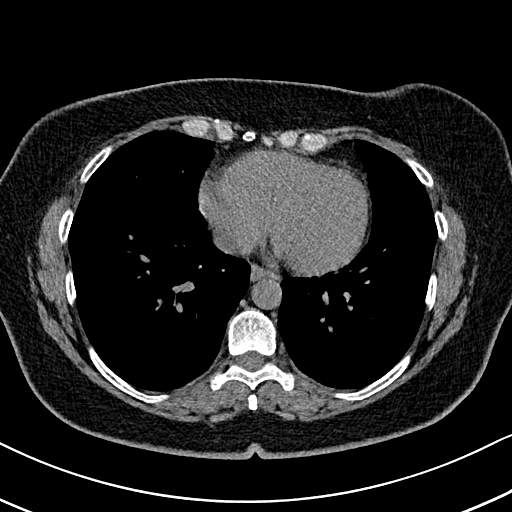
[im 53/144  lung]
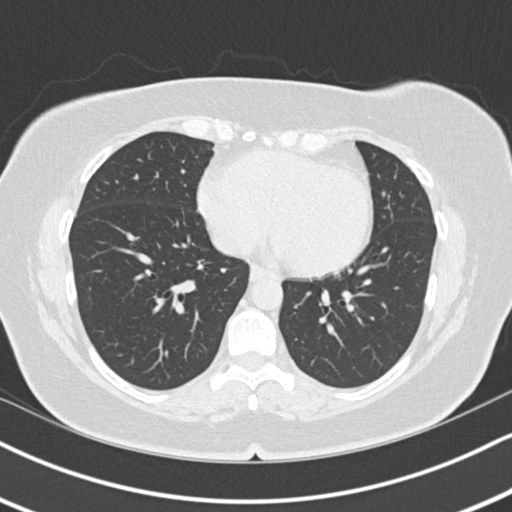
[im 64/144  lung]
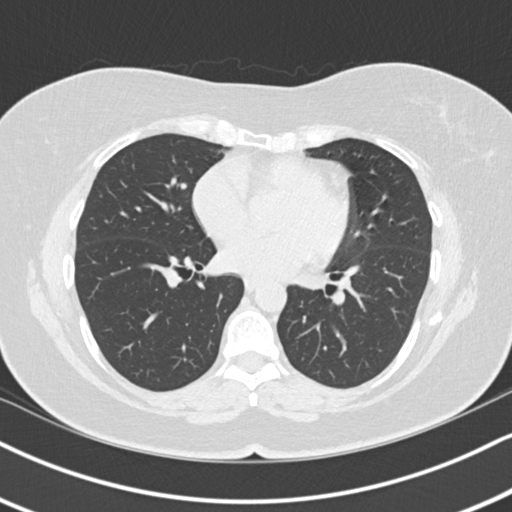
[im 80/144  lung]
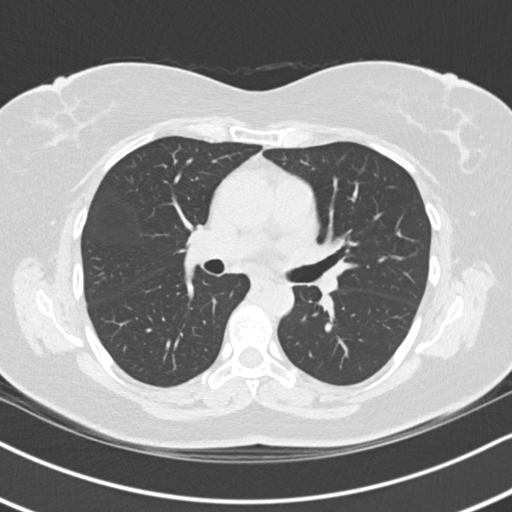
[im 91/144  lung]
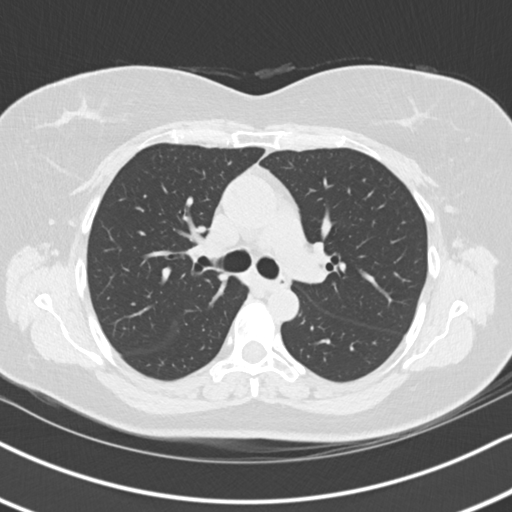
[im 101/144  mediastinal]
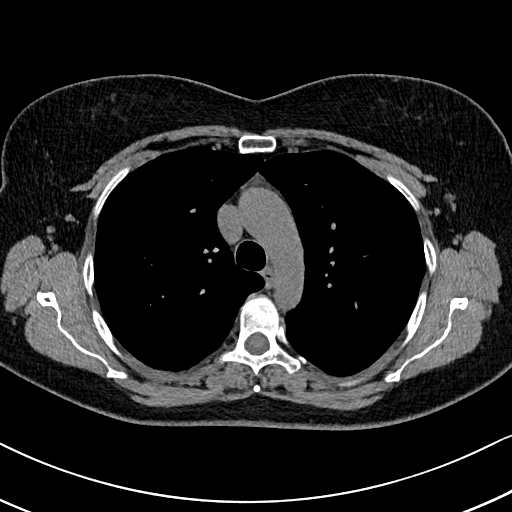
[im 101/144  lung]
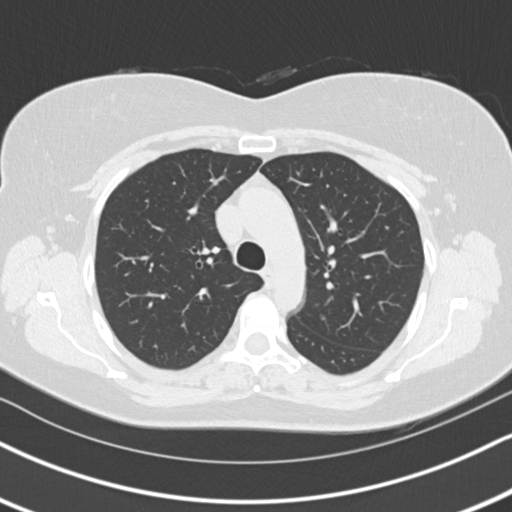
[im 112/144  lung]
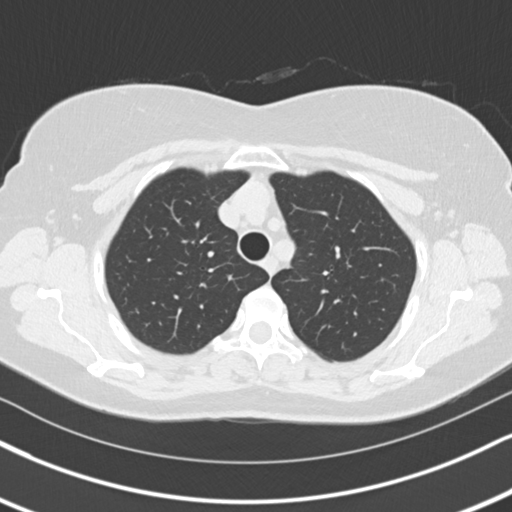
[im 122/144  lung]
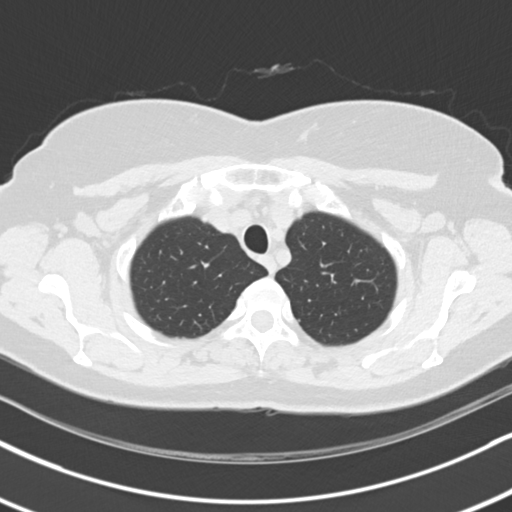
[im 133/144  lung]
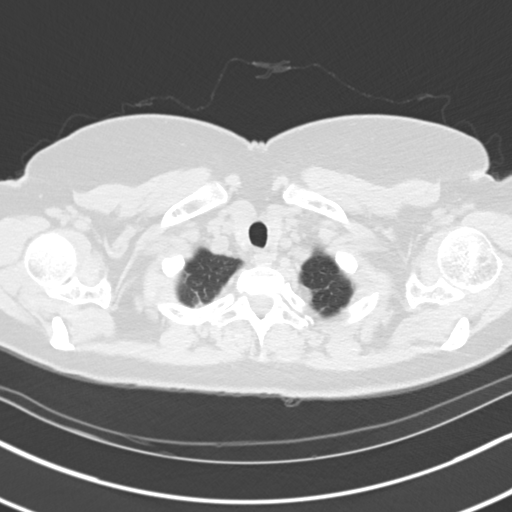

[Series 5: coronal · coronal · 0.59mm/px · 3 of 109 slices shown]
[im 22/109  lung]
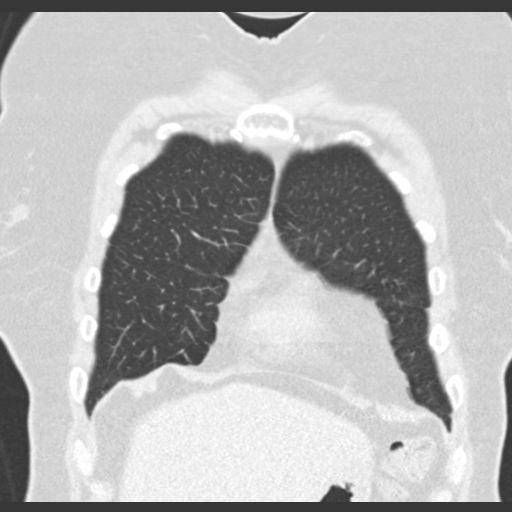
[im 44/109  lung]
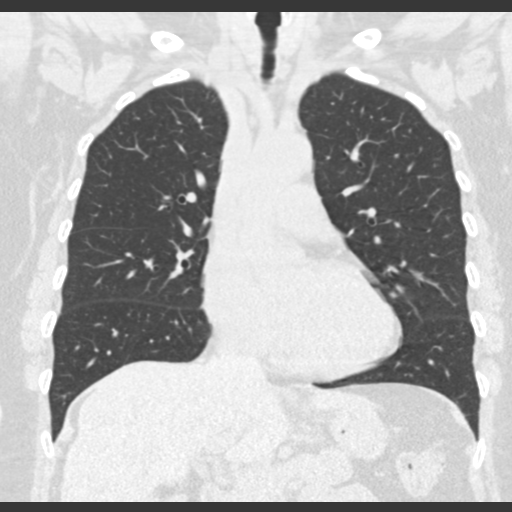
[im 65/109  lung]
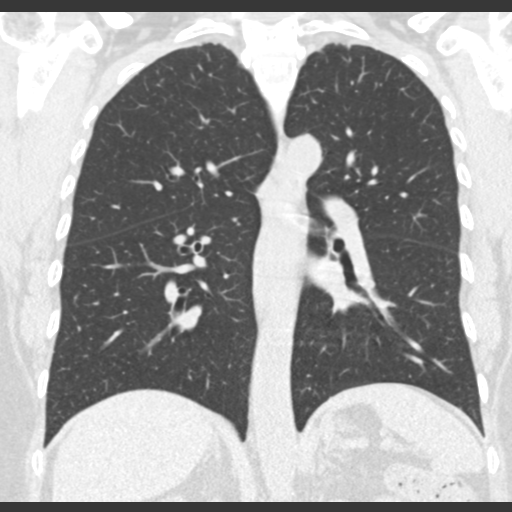

[15 of 36 positions shown; findings below may reference images not displayed]

FINDINGS: Cardiovascular: The heart is within normal limits. There is no
evidence of thoracic aortic aneurysm.

Mediastinum/Nodes: No enlarged mediastinal or axillary lymph nodes.
Thyroid gland, trachea, and esophagus demonstrate no significant
findings.

Lungs/Pleura: A 6 mm nodule with appearing cavity is identified
within the left lower lobe (image 102). The lungs are otherwise
clear. There is no evidence of pulmonary abnormality within the left
upper lobe in the area of the radiographic finding. The radiographic
opacity represents vascular structures. No airspace disease,
consolidation, mass, suspicious nodule or consolidation identified.

Upper Abdomen: No acute abnormality.

Musculoskeletal: No chest wall mass or suspicious bone lesions
identified.
IMPRESSION: 6 mm possible cavitary nodule within the left lower lobe. Three to
six-month CT follow-up is recommended.

No other abnormalities identified. The radiographic abnormality
within the left upper lobe represents vascular structures.

## 2019-03-10 ENCOUNTER — Other Ambulatory Visit: Payer: Self-pay | Admitting: Cardiovascular Disease

## 2019-03-10 MED ORDER — DILTIAZEM HCL ER COATED BEADS 120 MG PO CP24: 120.0000 mg | ORAL_CAPSULE | Freq: Every day | ORAL | 0 refills | Status: DC

## 2019-07-09 ENCOUNTER — Other Ambulatory Visit: Payer: Self-pay

## 2019-07-09 ENCOUNTER — Ambulatory Visit (INDEPENDENT_AMBULATORY_CARE_PROVIDER_SITE_OTHER): Payer: 59 | Admitting: Cardiovascular Disease

## 2019-07-09 ENCOUNTER — Encounter: Payer: Self-pay | Admitting: Cardiovascular Disease

## 2019-07-09 VITALS — BP 120/88 | HR 107 | Ht 63.0 in | Wt 173.0 lb

## 2019-07-09 DIAGNOSIS — R002 Palpitations: Secondary | ICD-10-CM

## 2019-07-09 DIAGNOSIS — I059 Rheumatic mitral valve disease, unspecified: Secondary | ICD-10-CM

## 2019-07-09 MED ORDER — DILTIAZEM HCL ER COATED BEADS 120 MG PO CP24: 120.0000 mg | ORAL_CAPSULE | Freq: Every day | ORAL | 3 refills | Status: DC

## 2019-07-09 NOTE — Patient Instructions (Signed)
Medication Instructions:  Your physician recommends that you continue on your current medications as directed. Please refer to the Current Medication list given to you today.  *If you need a refill on your cardiac medications before your next appointment, please call your pharmacy*   Lab Work: None Ordered If you have labs (blood work) drawn today and your tests are completely normal, you will receive your results only by: Marland Kitchen MyChart Message (if you have MyChart) OR . A paper copy in the mail If you have any lab test that is abnormal or we need to change your treatment, we will call you to review the results.   Testing/Procedures: Your physician has requested that you have an echocardiogram. Echocardiography is a painless test that uses sound waves to create images of your heart. It provides your doctor with information about the size and shape of your heart and how well your heart's chambers and valves are working. This procedure takes approximately one hour. There are no restrictions for this procedure.     Follow-Up: At [facility], you and your health needs are our priority.  As part of our continuing mission to provide you with exceptional heart care, we have created designated Provider Care Teams.  These Care Teams include your primary Cardiologist (physician) and Advanced Practice Providers (APPs -  Physician Assistants and Nurse Practitioners) who all work together to provide you with the care you need, when you need it.  We recommend signing up for the patient portal called "MyChart".  Sign up information is provided on this After Visit Summary.  MyChart is used to connect with patients for Virtual Visits (Telemedicine).  Patients are able to view lab/test results, encounter notes, upcoming appointments, etc.  Non-urgent messages can be sent to your provider as well.   To learn more about what you can do with MyChart, go to [email or web address].    Your next appointment:   1  year(s)  The format for your next appointment:   In Person  Provider:   You may see Lauree Chandler, MD or one of the following Advanced Practice Providers on your designated Care Team:    Melina Copa, PA-C  Ermalinda Barrios, PA-C

## 2019-07-09 NOTE — Progress Notes (Signed)
Chief Complaint  Patient presents with  . Follow-up    PACs   History of Present Illness: 59 yo female with history of palpitations who is here today for follow up. I saw her as a new patient for evaluation of dyspnea in October 2017. She has been followed in the past by Dr. Aldona Bar and Dr. Percival Spanish for palpitations. She last saw Dr. Percival Spanish in 2012. She has been treated with beta blocker therapy in the past but has most recently been on Cardizem. When I met her in 2017, she described dyspnea at rest and with exertion but no chest pain. Echo 01/23/16 with normal LV systolic function, trivial AI, trivial MI, moderate TR. Thyroid studies ok in primary care. Cardiac monitor November 2017 with sinus rhythm, lowest HR 54 bpm, rare PACs, PVCs. No SVT or VT noted.   She is here today for follow up. The patient denies any chest pain, dyspnea, palpitations, lower extremity edema, orthopnea, PND, dizziness, near syncope or syncope.   Primary Care Physician: Vania Rea, MD  Past Medical History:  Diagnosis Date  . Arrhythmia    tachycardia  &palpitations  . Benign neoplasm of colon    colon polyp    Past Surgical History:  Procedure Laterality Date  . None      Current Outpatient Medications  Medication Sig Dispense Refill  . diltiazem (CARDIZEM CD) 120 MG 24 hr capsule Take 1 capsule (120 mg total) by mouth daily. 90 capsule 3   No current facility-administered medications for this visit.    No Known Allergies  Social History   Socioeconomic History  . Marital status: Married    Spouse name: Not on file  . Number of children: 2  . Years of education: Not on file  . Highest education level: Not on file  Occupational History  . Occupation: Self employed Engineer, water   Tobacco Use  . Smoking status: Never Smoker  . Smokeless tobacco: Never Used  Substance and Sexual Activity  . Alcohol use: No  . Drug use: No  . Sexual activity: Not on file  Other Topics  Concern  . Not on file  Social History Narrative   The patient is married. 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 She does not smoke cigarettes and never has. She occasionally drinks alcohol.   Social Determinants of Health   Financial Resource Strain:   . Difficulty of Paying Living Expenses:   Food Insecurity:   . Worried About Charity fundraiser in the Last Year:   . Arboriculturist in the Last Year:   Transportation Needs:   . Film/video editor (Medical):   Marland Kitchen Lack of Transportation (Non-Medical):   Physical Activity:   . Days of Exercise per Week:   . Minutes of Exercise per Session:   Stress:   . Feeling of Stress :   Social Connections:   . Frequency of Communication with Friends and Family:   . Frequency of Social Gatherings with Friends and Family:   . Attends Religious Services:   . Active Member of Clubs or Organizations:   . Attends Archivist Meetings:   Marland Kitchen Marital Status:   Intimate Partner Violence:   . Fear of Current or Ex-Partner:   . Emotionally Abused:   Marland Kitchen Physically Abused:   . Sexually Abused:     Family History  Problem Relation Age of Onset  . Lung cancer Father   .  CAD Neg Hx     Review of Systems:  As stated in the HPI and otherwise negative.   BP 120/88   Pulse (!) 107   Ht '5\' 3"'$  (1.6 m)   Wt 173 lb (78.5 kg)   SpO2 97%   BMI 30.65 kg/m   Physical Examination: General: Well developed, well nourished, NAD  HEENT: OP clear, mucus membranes moist  SKIN: warm, dry. No rashes. Neuro: No focal deficits  Musculoskeletal: Muscle strength 5/5 all ext  Psychiatric: Mood and affect normal  Neck: No JVD, no carotid bruits, no thyromegaly, no lymphadenopathy.  Lungs:Clear bilaterally, no wheezes, rhonci, crackles Cardiovascular: Regular tachy. No murmurs, gallops or rubs. Abdomen:Soft. Bowel sounds present. Non-tender.  Extremities: No lower extremity edema. Pulses are 2 + in the bilateral DP/PT.  Echo  01/23/16: Left ventricle: The cavity size was normal. Wall thickness was   normal. Systolic function was normal. The estimated ejection   fraction was in the range of 55% to 60%. Wall motion was normal;   there were no regional wall motion abnormalities. Left   ventricular diastolic function parameters were normal. - Aortic valve: There was trivial regurgitation. - Mitral valve: There was mild regurgitation. - Tricuspid valve: There was moderate regurgitation. Impressions: - Normal LV systolic and diastolic function; trace AI; mild MR;   moderate TR.  EKG:  EKG is  ordered today. The ekg ordered today demonstrates Sinus tachycardia, rate 107 bpm  Recent Labs: No results found for requested labs within last 8760 hours.   Lipid Panel No results found for: CHOL, TRIG, HDL, CHOLHDL, VLDL, LDLCALC, LDLDIRECT   Wt Readings from Last 3 Encounters:  07/09/19 173 lb (78.5 kg)  06/01/18 176 lb 6.4 oz (80 kg)  04/21/18 174 lb (78.9 kg)     Other studies Reviewed: Additional studies/ records that were reviewed today include: . Review of the above records demonstrates:   Assessment and Plan:   1. Palpitations/PACs/PVCs: Cardiac monitor November 2017 with rare PVCs and PACs.  No recent palpitations. Continue Cardizem.   2. Mitral regurgitation:  Mild by echo in October 2017. Repeat echo now.     Current medicines are reviewed at length with the patient today.  The patient does not have concerns regarding medicines.  The following changes have been made:  no change  Labs/ tests ordered today include:   Orders Placed This Encounter  Procedures  . EKG 12-Lead  . ECHOCARDIOGRAM COMPLETE    Disposition:   FU with me in 12 months  Signed, Lauree Chandler, MD 07/09/2019 4:50 PM    [facility] [place], [place], [place]  [address] Phone: [phone]; Fax: [phone]

## 2019-07-27 ENCOUNTER — Other Ambulatory Visit (HOSPITAL_COMMUNITY): Payer: 59

## 2019-08-18 ENCOUNTER — Ambulatory Visit (HOSPITAL_COMMUNITY): Payer: 59 | Attending: Internal Medicine

## 2019-08-18 ENCOUNTER — Other Ambulatory Visit: Payer: Self-pay

## 2019-08-18 DIAGNOSIS — R002 Palpitations: Secondary | ICD-10-CM | POA: Insufficient documentation

## 2019-08-18 DIAGNOSIS — I059 Rheumatic mitral valve disease, unspecified: Secondary | ICD-10-CM | POA: Insufficient documentation

## 2019-10-15 ENCOUNTER — Other Ambulatory Visit: Payer: 59

## 2020-08-16 ENCOUNTER — Encounter: Payer: Self-pay | Admitting: Cardiovascular Disease

## 2020-08-16 ENCOUNTER — Other Ambulatory Visit: Payer: Self-pay

## 2020-08-16 ENCOUNTER — Ambulatory Visit (INDEPENDENT_AMBULATORY_CARE_PROVIDER_SITE_OTHER): Payer: 59 | Admitting: Cardiovascular Disease

## 2020-08-16 VITALS — BP 116/74 | HR 78 | Ht 63.0 in | Wt 166.2 lb

## 2020-08-16 DIAGNOSIS — R002 Palpitations: Secondary | ICD-10-CM

## 2020-08-16 DIAGNOSIS — E785 Hyperlipidemia, unspecified: Secondary | ICD-10-CM | POA: Diagnosis not present

## 2020-08-16 MED ORDER — DILTIAZEM HCL ER COATED BEADS 120 MG PO CP24: 120.0000 mg | ORAL_CAPSULE | Freq: Every day | ORAL | 3 refills | Status: DC

## 2020-08-16 NOTE — Progress Notes (Signed)
Chief Complaint  Patient presents with  . Follow-up    palpitations   History of Present Illness: 60 yo female with history of palpitations who is here today for follow up. I saw her as a new patient for evaluation of dyspnea in October 2017. She has been followed in the past by Dr. Caprice Kluver and Dr. Antoine Poche for palpitations. She last saw Dr. Antoine Poche in 2012. She has been treated with beta blocker therapy in the past but has most recently been on Cardizem. When I met her in 2017, she described dyspnea at rest and with exertion but no chest pain. Echo 01/23/16 with normal LV systolic function, trivial AI, trivial MI, moderate TR. Thyroid studies ok in primary care. Cardiac monitor November 2017 with sinus rhythm, lowest HR 54 bpm, rare PACs, PVCs. No SVT or VT noted. Echo May 2021 with LVEF=60-65%, trivial to mild MR, trivial AI.   She is here today for follow up. The patient denies any chest pain, dyspnea, palpitations, lower extremity edema, orthopnea, PND, dizziness, near syncope or syncope.   Primary Care Physician: Annamaria Helling, MD  Past Medical History:  Diagnosis Date  . Arrhythmia    tachycardia  &palpitations  . Benign neoplasm of colon    colon polyp    Past Surgical History:  Procedure Laterality Date  . None      Current Outpatient Medications  Medication Sig Dispense Refill  . diltiazem (CARDIZEM CD) 120 MG 24 hr capsule Take 1 capsule (120 mg total) by mouth daily. 90 capsule 3   No current facility-administered medications for this visit.    No Known Allergies  Social History   Socioeconomic History  . Marital status: Married    Spouse name: Not on file  . Number of children: 2  . Years of education: Not on file  . Highest education level: Not on file  Occupational History  . Occupation: Self employed Clinical cytogeneticist   Tobacco Use  . Smoking status: Never Smoker  . Smokeless tobacco: Never Used  Vaping Use  . Vaping Use: Never used   Substance and Sexual Activity  . Alcohol use: No  . Drug use: No  . Sexual activity: Not on file  Other Topics Concern  . Not on file  Social History Narrative   The patient is married. 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 She does not smoke cigarettes and never has. She occasionally drinks alcohol.   Social Determinants of Health   Financial Resource Strain: Not on file  Food Insecurity: Not on file  Transportation Needs: Not on file  Physical Activity: Not on file  Stress: Not on file  Social Connections: Not on file  Intimate Partner Violence: Not on file    Family History  Problem Relation Age of Onset  . Lung cancer Father   . CAD Neg Hx     Review of Systems:  As stated in the HPI and otherwise negative.   BP 116/74   Pulse 78   Ht 5\' 3"  (1.6 m)   Wt 166 lb 3.2 oz (75.4 kg)   SpO2 98%   BMI 29.44 kg/m   Physical Examination: General: Well developed, well nourished, NAD  HEENT: OP clear, mucus membranes moist  SKIN: warm, dry. No rashes. Neuro: No focal deficits  Musculoskeletal: Muscle strength 5/5 all ext  Psychiatric: Mood and affect normal  Neck: No JVD, no carotid bruits, no thyromegaly, no lymphadenopathy.  Lungs:Clear bilaterally,  no wheezes, rhonci, crackles Cardiovascular: Regular tachy. No murmurs, gallops or rubs. Abdomen:Soft. Bowel sounds present. Non-tender.  Extremities: No lower extremity edema. Pulses are 2 + in the bilateral DP/PT.  Echo 08/18/19:  1. Left ventricular ejection fraction, by estimation, is 60 to 65%. The  left ventricle has normal function. The left ventricle has no regional  wall motion abnormalities. Left ventricular diastolic parameters are  consistent with Grade I diastolic  dysfunction (impaired relaxation).  2. Right ventricular systolic function is normal. The right ventricular  size is normal. There is normal pulmonary artery systolic pressure.  3. The mitral valve is abnormal. Trivial mitral  valve regurgitation.  4. The aortic valve is tricuspid. Aortic valve regurgitation is trivial.  Mild aortic valve sclerosis is present, with no evidence of aortic valve  stenosis.  5. The inferior vena cava is normal in size with greater than 50%  respiratory variability, suggesting right atrial pressure of 3 mmHg.   EKG:  EKG is  ordered today. The ekg ordered today demonstrates Sinus, Non-specific ST and T wave abn, unchanged  Recent Labs: No results found for requested labs within last 8760 hours.   Lipid Panel No results found for: CHOL, TRIG, HDL, CHOLHDL, VLDL, LDLCALC, LDLDIRECT   Wt Readings from Last 3 Encounters:  08/16/20 166 lb 3.2 oz (75.4 kg)  07/09/19 173 lb (78.5 kg)  06/01/18 176 lb 6.4 oz (80 kg)     Other studies Reviewed: Additional studies/ records that were reviewed today include: . Review of the above records demonstrates:   Assessment and Plan:   1. Palpitations/PACs/PVCs: Cardiac monitor November 2017 with rare PVCs and PACs.  She has no palpitations. Continue Cardizem. Check BMET now  2. Mitral regurgitation:  Mild by echo in May 2021.   3. Hyperlipidemia: I have not reviewed her lipids before today. LDL 160 in July 2021 in her [place] clinic. She has asked me to review. Will repeat lipids, LFTs now. Will decide based on labs if she needs a statin  Current medicines are reviewed at length with the patient today.  The patient does not have concerns regarding medicines.  The following changes have been made:  no change  Labs/ tests ordered today include:   Orders Placed This Encounter  Procedures  . Hepatic function panel  . Lipid panel  . Basic metabolic panel  . EKG 12-Lead    Disposition:   FU with me in 12 months  Signed, Lauree Chandler, MD 08/16/2020 4:45 PM    [facility] [place], [place], [place]  [address] Phone: [phone]; Fax: [phone]

## 2020-08-16 NOTE — Patient Instructions (Addendum)
Medication Instructions:  Your physician recommends that you continue on your current medications as directed. Please refer to the Current Medication list given to you today.  *If you need a refill on your cardiac medications before your next appointment, please call your pharmacy*   Lab Work: BMET, Lipids, and liver.  Please return at your convenience for fasting blood work. If you have labs (blood work) drawn today and your tests are completely normal, you will receive your results only by: Marland Kitchen MyChart Message (if you have MyChart) OR . A paper copy in the mail If you have any lab test that is abnormal or we need to change your treatment, we will call you to review the results.   Testing/Procedures: None   Follow-Up: At [facility], you and your health needs are our priority.  As part of our continuing mission to provide you with exceptional heart care, we have created designated Provider Care Teams.  These Care Teams include your primary Cardiologist (physician) and Advanced Practice Providers (APPs -  Physician Assistants and Nurse Practitioners) who all work together to provide you with the care you need, when you need it.  We recommend signing up for the patient portal called "MyChart".  Sign up information is provided on this After Visit Summary.  MyChart is used to connect with patients for Virtual Visits (Telemedicine).  Patients are able to view lab/test results, encounter notes, upcoming appointments, etc.  Non-urgent messages can be sent to your provider as well.   To learn more about what you can do with MyChart, go to [email or web address].    Your next appointment:   1 year(s)  The format for your next appointment:   In Person  Provider:   You may see Lauree Chandler, MD or one of the following Advanced Practice Providers on your designated Care Team:    Melina Copa, PA-C  Ermalinda Barrios, PA-C    Other Instructions

## 2020-08-18 ENCOUNTER — Other Ambulatory Visit: Payer: Self-pay

## 2020-08-18 ENCOUNTER — Other Ambulatory Visit: Payer: 59 | Admitting: *Deleted

## 2020-08-18 DIAGNOSIS — E785 Hyperlipidemia, unspecified: Secondary | ICD-10-CM

## 2020-08-18 LAB — BASIC METABOLIC PANEL
BUN/Creatinine Ratio: 16 (ref 12–28)
BUN: 13 mg/dL (ref 8–27)
CO2: 23 mmol/L (ref 20–29)
Calcium: 9.9 mg/dL (ref 8.7–10.3)
Chloride: 103 mmol/L (ref 96–106)
Creatinine, Ser: 0.83 mg/dL (ref 0.57–1.00)
Glucose: 92 mg/dL (ref 65–99)
Potassium: 4 mmol/L (ref 3.5–5.2)
Sodium: 141 mmol/L (ref 134–144)
eGFR: 80 mL/min/{1.73_m2} (ref >59)

## 2020-08-22 NOTE — Telephone Encounter (Signed)
Late entry for 08/21/20.  Faxed add on request for lipids and liver panel.      [employer] today and verified they have received the add on request.  It is waiting processing.  Pt has been updated.

## 2020-08-23 LAB — HEPATIC FUNCTION PANEL
ALT: 23 IU/L (ref 0–32)
AST: 22 IU/L (ref 0–40)
Albumin: 4.6 g/dL (ref 3.8–4.8)
Alkaline Phosphatase: 143 IU/L — ABNORMAL HIGH (ref 44–121)
Bilirubin Total: 0.3 mg/dL (ref 0.0–1.2)
Bilirubin, Direct: <0.1 mg/dL (ref 0.00–0.40)
Total Protein: 7.7 g/dL (ref 6.0–8.5)

## 2020-08-23 LAB — LIPID PANEL
Chol/HDL Ratio: 3.8 ratio (ref 0.0–4.4)
Cholesterol, Total: 225 mg/dL — ABNORMAL HIGH (ref 100–199)
HDL: 59 mg/dL (ref >39)
LDL Chol Calc (NIH): 154 mg/dL — ABNORMAL HIGH (ref 0–99)
Triglycerides: 68 mg/dL (ref 0–149)
VLDL Cholesterol Cal: 12 mg/dL (ref 5–40)

## 2020-08-23 LAB — SPECIMEN STATUS REPORT

## 2020-08-28 ENCOUNTER — Telehealth: Payer: Self-pay | Admitting: *Deleted

## 2020-08-28 DIAGNOSIS — E785 Hyperlipidemia, unspecified: Secondary | ICD-10-CM

## 2020-08-28 MED ORDER — ROSUVASTATIN CALCIUM 5 MG PO TABS
5.0000 mg | ORAL_TABLET | Freq: Every day | ORAL | 3 refills | Status: DC
Start: 2020-08-28 — End: 2021-07-18

## 2020-08-28 NOTE — Telephone Encounter (Signed)
-----   Message from Burnell Blanks, MD sent at 08/25/2020 12:25 PM EDT ----- LDL is 154. LFTs ok. I would recommend that she start on Crestor 5 mg daily and repeat lipids and LFTS in 12 weeks. cdm

## 2020-08-28 NOTE — Telephone Encounter (Signed)
Spoke with patient.  She is in agreement to start Crestor 5 mg and will return for fasting labs 11/28/20.

## 2020-11-28 ENCOUNTER — Other Ambulatory Visit: Payer: 59

## 2021-06-11 ENCOUNTER — Other Ambulatory Visit: Payer: Self-pay | Admitting: Cardiovascular Disease

## 2021-07-10 DIAGNOSIS — K644 Residual hemorrhoidal skin tags: Secondary | ICD-10-CM | POA: Diagnosis not present

## 2021-07-10 DIAGNOSIS — Z86010 Personal history of colonic polyps: Secondary | ICD-10-CM | POA: Diagnosis not present

## 2021-07-10 DIAGNOSIS — D123 Benign neoplasm of transverse colon: Secondary | ICD-10-CM | POA: Diagnosis not present

## 2021-07-10 DIAGNOSIS — K648 Other hemorrhoids: Secondary | ICD-10-CM | POA: Diagnosis not present

## 2021-07-10 DIAGNOSIS — K5730 Diverticulosis of large intestine without perforation or abscess without bleeding: Secondary | ICD-10-CM | POA: Diagnosis not present

## 2021-07-10 DIAGNOSIS — D122 Benign neoplasm of ascending colon: Secondary | ICD-10-CM | POA: Diagnosis not present

## 2021-07-12 DIAGNOSIS — D123 Benign neoplasm of transverse colon: Secondary | ICD-10-CM | POA: Diagnosis not present

## 2021-07-12 DIAGNOSIS — D122 Benign neoplasm of ascending colon: Secondary | ICD-10-CM | POA: Diagnosis not present

## 2021-07-18 ENCOUNTER — Other Ambulatory Visit: Payer: Self-pay | Admitting: Cardiovascular Disease

## 2021-10-18 NOTE — Progress Notes (Signed)
Chief Complaint  Patient presents with   Follow-up    Mitral valve regurgitation   History of Present Illness: 61 yo female with history of mild mitral regurgitation, palpitations, PVCs and  PACs who is here today for follow up. I saw her as a new patient for evaluation of dyspnea in October 2017. She had been followed in the past by Dr. Caprice Kluver and Dr. Antoine Poche for palpitations. She last saw Dr. Antoine Poche in 2012. She has been treated with beta blocker therapy in the past but has most recently been on Cardizem. When I met her in 2017, she described dyspnea at rest and with exertion but no chest pain. Echo 01/23/16 with normal LV systolic function, trivial AI, trivial MI, moderate TR. Cardiac monitor November 2017 with sinus rhythm, lowest HR 54 bpm, rare PACs, PVCs. No SVT or VT noted. Echo May 2021 with LVEF=60-65%, trivial to mild MR, trivial AI.   She is here today for follow up. The patient denies any chest pain, dyspnea, palpitations, lower extremity edema, orthopnea, PND, dizziness, near syncope or syncope.   Primary Care Physician: Annamaria Helling, MD  Past Medical History:  Diagnosis Date   Arrhythmia    tachycardia  &palpitations   Benign neoplasm of colon    colon polyp    Past Surgical History:  Procedure Laterality Date   None      Current Outpatient Medications  Medication Sig Dispense Refill   diltiazem (CARDIZEM CD) 120 MG 24 hr capsule Take 1 capsule (120 mg total) by mouth daily. 30 capsule 11   rosuvastatin (CRESTOR) 5 MG tablet Take 1 tablet (5 mg total) by mouth daily. 30 tablet 11   No current facility-administered medications for this visit.    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 She does not smoke cigarettes and never has. She occasionally drinks alcohol.   Social Determinants of Health   Financial Resource Strain: Not on file  Food Insecurity: Not on file  Transportation Needs: Not on file  Physical Activity: Not on file  Stress: Not on file  Social Connections: Not on file  Intimate Partner Violence: Not on file    Family History  Problem Relation Age of Onset   Lung cancer Father    CAD Neg Hx     Review of Systems:  As stated in the HPI and otherwise negative.   BP 118/70   Pulse 81   Ht 5\' 3"  (1.6 m)   Wt 173 lb 12.8 oz (78.8 kg)   SpO2 97%   BMI 30.79 kg/m   Physical Examination: General: Well developed, well nourished, NAD  HEENT: OP clear, mucus membranes moist  SKIN: warm, dry. No rashes. Neuro: No focal deficits  Musculoskeletal: Muscle strength 5/5 all ext  Psychiatric: Mood and affect normal  Neck: No JVD, no carotid bruits, no thyromegaly, no lymphadenopathy.  Lungs:Clear bilaterally, no wheezes, rhonci, crackles Cardiovascular: Regular rate and rhythm. No murmurs, gallops or rubs. Abdomen:Soft. Bowel sounds present. Non-tender.  Extremities: No lower extremity edema. Pulses are 2 + in the bilateral DP/PT.  Echo 08/18/19:  1. Left ventricular ejection fraction, by estimation, is 60 to 65%. The  left ventricle has normal function. The left ventricle has no regional  wall motion abnormalities. Left ventricular diastolic parameters are  consistent with Grade I diastolic  dysfunction (impaired relaxation).   2. Right ventricular systolic function is normal. The right ventricular  size is normal. There is  normal pulmonary artery systolic pressure.   3. The mitral valve is abnormal. Trivial mitral valve regurgitation.   4. The aortic valve is tricuspid. Aortic valve regurgitation is trivial.  Mild aortic valve sclerosis is present, with no evidence of aortic valve  stenosis.   5. The inferior vena cava is normal in size with greater than 50%  respiratory variability, suggesting right atrial pressure of 3 mmHg.   EKG:  EKG is ordered today. The ekg ordered today demonstrates Sinus, Non-specific ST abnormality-unchanged  Recent Labs: No results found for requested labs within last 365 days.   Lipid Panel    Component Value Date/Time   CHOL 225 (H) 08/18/2020 0000   TRIG 68 08/18/2020 0000   HDL 59 08/18/2020 0000   CHOLHDL 3.8 08/18/2020 0000   LDLCALC 154 (H) 08/18/2020 0000     Wt Readings from Last 3 Encounters:  10/19/21 173 lb 12.8 oz (78.8 kg)  08/16/20 166 lb 3.2 oz (75.4 kg)  07/09/19 173 lb (78.5 kg)     Other studies Reviewed: Additional studies/ records that were reviewed today include: . Review of the above records demonstrates:   Assessment and Plan:   1. Palpitations/PACs/PVCs: Cardiac monitor November 2017 with rare PVCs and PACs. No palpitations. Continue Cardizem.   2. Mitral regurgitation:  Mild by echo in May 2021. Will repeat echo in 2024.   3. Hyperlipidemia: LDL 76 in August 2022. Continue statin.    Current medicines are reviewed at length with the patient today.  The patient does not have concerns regarding medicines.  The following changes have been made:  no change  Labs/ tests ordered today include:   Orders Placed This Encounter  Procedures   EKG 12-Lead    Disposition:   FU with me in 12 months  Signed, Lauree Chandler, MD 10/19/2021 4:46 PM    [facility] [place], [place], [place]  [address] Phone: [phone]; Fax: [phone]

## 2021-10-19 ENCOUNTER — Encounter: Payer: Self-pay | Admitting: Cardiovascular Disease

## 2021-10-19 ENCOUNTER — Ambulatory Visit: Payer: 59 | Admitting: Cardiovascular Disease

## 2021-10-19 VITALS — BP 118/70 | HR 81 | Ht 63.0 in | Wt 173.8 lb

## 2021-10-19 DIAGNOSIS — E7800 Pure hypercholesterolemia, unspecified: Secondary | ICD-10-CM | POA: Diagnosis not present

## 2021-10-19 DIAGNOSIS — I493 Ventricular premature depolarization: Secondary | ICD-10-CM | POA: Diagnosis not present

## 2021-10-19 DIAGNOSIS — I340 Nonrheumatic mitral (valve) insufficiency: Secondary | ICD-10-CM

## 2021-10-19 MED ORDER — ROSUVASTATIN CALCIUM 5 MG PO TABS: 5.0000 mg | ORAL_TABLET | Freq: Every day | ORAL | 11 refills | Status: DC

## 2021-10-19 MED ORDER — ROSUVASTATIN CALCIUM 5 MG PO TABS: 5.0000 mg | ORAL_TABLET | Freq: Every day | ORAL | 3 refills | Status: DC

## 2021-10-19 MED ORDER — DILTIAZEM HCL ER COATED BEADS 120 MG PO CP24: 120.0000 mg | ORAL_CAPSULE | Freq: Every day | ORAL | 11 refills | Status: DC

## 2021-10-19 NOTE — Patient Instructions (Signed)
Medication Instructions:  No changes *If you need a refill on your cardiac medications before your next appointment, please call your pharmacy*   Lab Work: none If you have labs (blood work) drawn today and your tests are completely normal, you will receive your results only by: [place] (if you have MyChart) OR A paper copy in the mail If you have any lab test that is abnormal or we need to change your treatment, we will call you to review the results.   Testing/Procedures: none   Follow-Up: At [facility], you and your health needs are our priority.  As part of our continuing mission to provide you with exceptional heart care, we have created designated Provider Care Teams.  These Care Teams include your primary Cardiologist (physician) and Advanced Practice Providers (APPs -  Physician Assistants and Nurse Practitioners) who all work together to provide you with the care you need, when you need it.   Your next appointment:   12 month(s)  The format for your next appointment:   In Person  Provider:   Lauree Chandler, MD     Important Information About Sugar

## 2021-10-19 NOTE — Addendum Note (Signed)
Addended by: Rodman Key on: 10/19/2021 04:50 PM   Modules accepted: Orders

## 2021-10-22 ENCOUNTER — Encounter: Payer: Self-pay | Admitting: Cardiovascular Disease

## 2021-12-03 DIAGNOSIS — Z01419 Encounter for gynecological examination (general) (routine) without abnormal findings: Secondary | ICD-10-CM | POA: Diagnosis not present

## 2021-12-03 DIAGNOSIS — Z0000 Encounter for general adult medical examination without abnormal findings: Secondary | ICD-10-CM | POA: Diagnosis not present

## 2021-12-03 DIAGNOSIS — Z1231 Encounter for screening mammogram for malignant neoplasm of breast: Secondary | ICD-10-CM | POA: Diagnosis not present

## 2022-11-01 ENCOUNTER — Other Ambulatory Visit: Payer: Self-pay | Admitting: Cardiovascular Disease

## 2022-11-17 ENCOUNTER — Other Ambulatory Visit: Payer: Self-pay | Admitting: Cardiovascular Disease

## 2022-11-21 ENCOUNTER — Ambulatory Visit: Payer: 59 | Attending: Cardiovascular Disease | Admitting: Cardiovascular Disease

## 2022-11-21 ENCOUNTER — Other Ambulatory Visit: Payer: Self-pay | Admitting: *Deleted

## 2022-11-21 ENCOUNTER — Other Ambulatory Visit: Payer: Self-pay | Admitting: Cardiovascular Disease

## 2022-11-21 ENCOUNTER — Ambulatory Visit: Payer: 59

## 2022-11-21 ENCOUNTER — Ambulatory Visit: Payer: 59 | Attending: Cardiovascular Disease

## 2022-11-21 ENCOUNTER — Encounter: Payer: Self-pay | Admitting: Cardiovascular Disease

## 2022-11-21 VITALS — BP 120/76 | HR 92 | Ht 63.0 in | Wt 174.2 lb

## 2022-11-21 DIAGNOSIS — R002 Palpitations: Secondary | ICD-10-CM

## 2022-11-21 DIAGNOSIS — I493 Ventricular premature depolarization: Secondary | ICD-10-CM

## 2022-11-21 DIAGNOSIS — E7800 Pure hypercholesterolemia, unspecified: Secondary | ICD-10-CM | POA: Diagnosis not present

## 2022-11-21 DIAGNOSIS — I340 Nonrheumatic mitral (valve) insufficiency: Secondary | ICD-10-CM

## 2022-11-21 MED ORDER — ROSUVASTATIN CALCIUM 5 MG PO TABS: 5.0000 mg | ORAL_TABLET | Freq: Every day | ORAL | 11 refills | Status: DC

## 2022-11-21 MED ORDER — DILTIAZEM HCL ER COATED BEADS 120 MG PO CP24: 120.0000 mg | ORAL_CAPSULE | Freq: Every day | ORAL | 11 refills | Status: DC

## 2022-11-21 NOTE — Progress Notes (Unsigned)
Enrolled for [employer] to mail a ZIO XT long term holter monitor to the patients address on file.  

## 2022-11-21 NOTE — Progress Notes (Unsigned)
ZIO XT serial # [id number] from office inventory applied to patient.

## 2022-11-21 NOTE — Progress Notes (Signed)
Chief Complaint  Patient presents with   Follow-up    PVCs   History of Present Illness: 62 yo female with history of mild mitral regurgitation, palpitations, PVCs and  PACs who is here today for follow up. I saw her as a new patient for evaluation of dyspnea in October 2017. She had been followed in the past by Dr. Caprice Kluver and Dr. Antoine Poche for palpitations. She last saw Dr. Antoine Poche in 2012. She has been treated with beta blocker therapy in the past but has most recently been on Cardizem. When I met her in 2017, she described dyspnea at rest and with exertion but no chest pain. Echo 01/23/16 with normal LV systolic function, trivial AI, trivial MI, moderate TR. Cardiac monitor November 2017 with sinus rhythm, lowest HR 54 bpm, rare PACs, PVCs. No SVT or VT noted. Echo May 2021 with LVEF=60-65%, trivial to mild MR, trivial AI.   She is here today for follow up. The patient denies any chest pain, dyspnea, palpitations, lower extremity edema, orthopnea, PND, dizziness, near syncope or syncope.   Primary Care Physician: Annamaria Helling, MD  Past Medical History:  Diagnosis Date   Arrhythmia    tachycardia  &palpitations   Benign neoplasm of colon    colon polyp    Past Surgical History:  Procedure Laterality Date   None      Current Outpatient Medications  Medication Sig Dispense Refill   Multiple Vitamin (MULTIVITAMIN ADULT PO) Take 1 tablet by mouth daily.     diltiazem (CARDIZEM CD) 120 MG 24 hr capsule Take 1 capsule (120 mg total) by mouth daily. 30 capsule 11   rosuvastatin (CRESTOR) 5 MG tablet Take 1 tablet (5 mg total) by mouth daily. 30 tablet 11   No current facility-administered medications for this visit.    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 She does not smoke cigarettes and never has. She occasionally drinks alcohol.   Social Determinants of Health   Financial Resource Strain: Not on file  Food Insecurity: Not on file  Transportation Needs: Not on file  Physical Activity: Not on file  Stress: Not on file  Social Connections: Not on file  Intimate Partner Violence: Not on file    Family History  Problem Relation Age of Onset   Lung cancer Father    CAD Neg Hx     Review of Systems:  As stated in the HPI and otherwise negative.   BP 120/76   Pulse 92   Ht 5\' 3"  (1.6 m)   Wt 79 kg   SpO2 97%   BMI 30.86 kg/m   Physical Examination: General: Well developed, well nourished, NAD  HEENT: OP clear, mucus membranes moist  SKIN: warm, dry. No rashes. Neuro: No  focal deficits  Musculoskeletal: Muscle strength 5/5 all ext  Psychiatric: Mood and affect normal  Neck: No JVD, no carotid bruits, no thyromegaly, no lymphadenopathy.  Lungs:Clear bilaterally, no wheezes, rhonci, crackles Cardiovascular: Regular rate and rhythm. No murmurs, gallops or rubs. Abdomen:Soft. Bowel sounds present. Non-tender.  Extremities: No lower extremity edema. Pulses are 2 + in the bilateral DP/PT.  Echo 08/18/19:  1. Left ventricular ejection fraction, by estimation, is 60 to 65%. The  left ventricle has normal function. The left ventricle has no regional  wall motion abnormalities. Left ventricular diastolic parameters are  consistent with Grade I diastolic  dysfunction (impaired relaxation).   2. Right ventricular systolic function is normal. The right  ventricular  size is normal. There is normal pulmonary artery systolic pressure.   3. The mitral valve is abnormal. Trivial mitral valve regurgitation.   4. The aortic valve is tricuspid. Aortic valve regurgitation is trivial.  Mild aortic valve sclerosis is present, with no evidence of aortic valve  stenosis.   5. The inferior vena cava is normal in size with greater than 50%  respiratory variability, suggesting right atrial pressure of 3 mmHg.   EKG:  EKG is ordered today. The ekg ordered today demonstrates  EKG Interpretation Date/Time:  Thursday November 21 2022 08:09:51 EDT Ventricular Rate:  92 PR Interval:  134 QRS Duration:  82 QT Interval:  336 QTC Calculation: 415 R Axis:   50  Text Interpretation: Normal sinus rhythm Nonspecific ST and T wave abnormality No previous ECGs available Confirmed by Verne Carrow [phone]) on 11/21/2022 8:27:34 AM    Recent Labs: No results found for requested labs within last 365 days.   Lipid Panel    Component Value Date/Time   CHOL 225 (H) 08/18/2020 0000   TRIG 68 08/18/2020 0000   HDL 59 08/18/2020 0000   CHOLHDL 3.8 08/18/2020 0000   LDLCALC 154 (H) 08/18/2020 0000     Wt Readings from Last 3 Encounters:  11/21/22 79 kg  10/19/21 78.8 kg  08/16/20 75.4 kg   Assessment and Plan:   1. PACs/PVCs: Cardiac monitor November 2017 with rare PVCs and PACs. She is having palpitations several times per week.  Continue Cardizem  2. Mitral regurgitation:  Mild by echo in May 2021. Will repeat echo now  3. Hyperlipidemia: Lipids followed in primary care. Continue statin    Labs/ tests ordered today include:   Orders Placed This Encounter  Procedures   EKG 12-Lead   ECHOCARDIOGRAM COMPLETE   Disposition:   FU with me in 12 months  Signed, Verne Carrow, MD 11/21/2022 10:03 AM    [facility] [address] [place], [place], [place]  [address] Phone: [phone]; Fax: [phone]

## 2022-11-21 NOTE — Patient Instructions (Signed)
Medication Instructions:  No changes *If you need a refill on your cardiac medications before your next appointment, please call your pharmacy*   Lab Work: none If you have labs (blood work) drawn today and your tests are completely normal, you will receive your results only by: MyChart Message (if you have MyChart) OR A paper copy in the mail If you have any lab test that is abnormal or we need to change your treatment, we will call you to review the results.   Testing/Procedures: Your physician has requested that you have an echocardiogram. Echocardiography is a painless test that uses sound waves to create images of your heart. It provides your doctor with information about the size and shape of your heart and how well your heart's chambers and valves are working. This procedure takes approximately one hour. There are no restrictions for this procedure. Please do NOT wear cologne, perfume, aftershave, or lotions (deodorant is allowed). Please arrive 15 minutes prior to your appointment time.  Zio Heart Monitor 14 days - see instructions below   Follow-Up: At [facility], you and your health needs are our priority.  As part of our continuing mission to provide you with exceptional heart care, we have created designated Provider Care Teams.  These Care Teams include your primary Cardiologist (physician) and Advanced Practice Providers (APPs -  Physician Assistants and Nurse Practitioners) who all work together to provide you with the care you need, when you need it.     Your next appointment:   12 month(s)  Provider:   Verne Carrow, MD     Other Instructions Carol [name]- Long Term Monitor Instructions  Your physician has requested you wear a ZIO patch monitor for 14 days.  This is a single patch monitor. [employer] supplies one patch monitor per enrollment. Additional stickers are not available. Please do not apply patch if you will be having a Nuclear Stress Test,   Echocardiogram, Cardiac CT, MRI, or Chest Xray during the period you would be wearing the  monitor. The patch cannot be worn during these tests. You cannot remove and re-apply the  ZIO XT patch monitor.  Your ZIO patch monitor will be mailed 3 day USPS to your address on file. It may take 3-5 days  to receive your monitor after you have been enrolled.  Once you have received your monitor, please review the enclosed instructions. Your monitor  has already been registered assigning a specific monitor serial # to you.  Billing and Patient Assistance Program Information  We have supplied [employer] with any of your insurance information on file for billing purposes. [employer] offers a sliding scale Patient Assistance Program for patients that do not have  insurance, or whose insurance does not completely cover the cost of the ZIO monitor.  You must apply for the Patient Assistance Program to qualify for this discounted rate.  To apply, please call [employer] at [phone], select option 4, select option 2, ask to apply for  Patient Assistance Program. Meredeth Ide will ask your household income, and how many people  are in your household. They will quote your out-of-pocket cost based on that information.  [employer] will also be able to set up a 71-month, interest-free payment plan if needed.  Applying the monitor  Shave hair from upper left chest.  Hold abrader disc by orange tab. Rub abrader in 40 strokes over the upper left chest as  indicated in your monitor instructions.  Clean area with 4 enclosed alcohol pads. Let  dry.  Apply patch as indicated in monitor instructions. Patch will be placed under collarbone on left  side of chest with arrow pointing upward.  Rub patch adhesive wings for 2 minutes. Remove white label marked "1". Remove the white  label marked "2". Rub patch adhesive wings for 2 additional minutes.  While looking in a mirror, press and release button in center of patch. A small  green light will  flash 3-4 times. This will be your only indicator that the monitor has been turned on.  Do not shower for the first 24 hours. You may shower after the first 24 hours.  Press the button if you feel a symptom. You will hear a small click. Record Date, Time and  Symptom in the Patient Logbook.  When you are ready to remove the patch, follow instructions on the last 2 pages of Patient  Logbook. Stick patch monitor onto the last page of Patient Logbook.  Place Patient Logbook in the blue and white box. Use locking tab on box and tape box closed  securely. The blue and white box has prepaid postage on it. Please place it in the mailbox as  soon as possible. Your physician should have your test results approximately 7 days after the  monitor has been mailed back to [facility].  Call [facility] Customer Care at [phone] if you have questions regarding  your ZIO XT patch monitor. Call them immediately if you see an orange light blinking on your  monitor.  If your monitor falls off in less than 4 days, contact our Monitor department at [phone].  If your monitor becomes loose or falls off after 4 days call [employer] at [phone] for  suggestions on securing your monitor

## 2022-12-11 DIAGNOSIS — R002 Palpitations: Secondary | ICD-10-CM | POA: Diagnosis not present

## 2022-12-11 DIAGNOSIS — I493 Ventricular premature depolarization: Secondary | ICD-10-CM | POA: Diagnosis not present

## 2022-12-13 ENCOUNTER — Ambulatory Visit (HOSPITAL_COMMUNITY): Payer: 59 | Attending: Cardiology

## 2022-12-13 DIAGNOSIS — I493 Ventricular premature depolarization: Secondary | ICD-10-CM | POA: Diagnosis not present

## 2022-12-13 DIAGNOSIS — I340 Nonrheumatic mitral (valve) insufficiency: Secondary | ICD-10-CM | POA: Insufficient documentation

## 2022-12-13 DIAGNOSIS — E7800 Pure hypercholesterolemia, unspecified: Secondary | ICD-10-CM | POA: Diagnosis not present

## 2022-12-13 LAB — ECHOCARDIOGRAM COMPLETE
Area-P 1/2: 2.51 cm2
S' Lateral: 2.8 cm

## 2022-12-13 MED ORDER — PERFLUTREN LIPID MICROSPHERE
1.0000 mL | INTRAVENOUS | Status: AC | PRN
Start: 2022-12-13 — End: 2022-12-13
  Administered 2022-12-13: 1 mL via INTRAVENOUS

## 2022-12-17 ENCOUNTER — Encounter: Payer: Self-pay | Admitting: Cardiovascular Disease

## 2022-12-20 MED ORDER — DILTIAZEM HCL 30 MG PO TABS: 30.0000 mg | ORAL_TABLET | Freq: Four times a day (QID) | ORAL | 3 refills | Status: DC | PRN

## 2022-12-20 NOTE — Telephone Encounter (Addendum)
-----   Message from Verne Carrow sent at 12/11/2022  9:33 AM EDT ----- Monitor with sinus but with 14 runs of SVT. This is what she is feeling when her heart is racing. She is on Cardizem. Can we reassure her that this is benign? We can give her short acting Diltiazem 30 mg to use as needed and review vagal maneuvers to use if her heart is racing. If she continues to have frequent runs, we can refer to EP. Chris __________________________________________________________  I called the patient and reviewed the monitor results and recommendations.  She voices understanding.  We discussed vagal maneuvers if she feels her heart racing as well as using diltiazem 30 mg.   I assured her that Dr. Clifton James said this is benign.  She will let us know if she develops increase in symptoms.  She only felt one of the 14 SVT runs.  She has monitored BP over the last week since reading the monitor findings and reports systolic has been between 99-114.  She denies symptoms of low bp.  Reassurance provided.  Pt grateful for the information provided.

## 2023-11-09 ENCOUNTER — Other Ambulatory Visit: Payer: Self-pay | Admitting: Cardiovascular Disease

## 2023-11-26 NOTE — Progress Notes (Unsigned)
 No chief complaint on file.  History of Present Illness: 63 yo female with history of mild mitral regurgitation, palpitations, PVCs and  PACs who is here today for follow up. She was seen in our office in October 2017 for evaluation of dyspnea. Echo 01/23/16 with normal LV systolic function, trivial AI, trivial MI, moderate TR. Cardiac monitor November 2017 with sinus rhythm, lowest HR 54 bpm, rare PACs, PVCs. No SVT or VT noted. Echo May 2021 with LVEF=60-65%, trivial to mild MR, trivial AI. Cardiac monitor in September 2024 with sinus and 14 short runs of SVT. Echo September 2024 with normal LV function and mild AI, trivial MR.   She is here today for follow up. The patient denies any chest pain, dyspnea, palpitations, lower extremity edema, orthopnea, PND, dizziness, near syncope or syncope.   Primary Care Physician: Rox Charleston, MD  Past Medical History:  Diagnosis Date   Arrhythmia    tachycardia  &palpitations   Benign neoplasm of colon    colon polyp    Past Surgical History:  Procedure Laterality Date   None      Current Outpatient Medications  Medication Sig Dispense Refill   diltiazem  (CARDIZEM  CD) 120 MG 24 hr capsule TAKE 1 CAPSULE BY MOUTH DAILY 90 capsule 0   diltiazem  (CARDIZEM ) 30 MG tablet Take 1 tablet (30 mg total) by mouth every 6 (six) hours as needed. 30 tablet 3   Multiple Vitamin (MULTIVITAMIN ADULT PO) Take 1 tablet by mouth daily.     rosuvastatin  (CRESTOR ) 5 MG tablet Take 1 tablet (5 mg total) by mouth daily. 30 tablet 11   No current facility-administered medications for this visit.    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 She does not smoke cigarettes and never has. She occasionally drinks alcohol.   Social Drivers of Corporate investment banker Strain: Low Risk  (01/20/2023)   Received from [facility]   Overall Financial Resource Strain (CARDIA)    Difficulty of Paying Living Expenses: Not hard at all  Food Insecurity: No Food Insecurity (01/20/2023)   Received from [facility]   Hunger Vital Sign    Within the past 12 months, you worried that your food would run out before you got the money to buy more.: Never true    Within the past 12 months, the food you bought just didn't last and you didn't have money to get more.: Never true  Transportation Needs: No Transportation Needs (01/20/2023)   Received from [facility] - [facility]    In the past 12 months, has lack of transportation kept you from medical appointments or from getting medications?: No    Lack of Transportation (Non-Medical): No  Physical  Activity: Not on file  Stress: Not on file  Social Connections: Not on file  Intimate Partner Violence: Not on file    Family History  Problem Relation Age of Onset   Lung cancer Father    CAD Neg Hx     Review of Systems:  As stated in the HPI and otherwise negative.   There were no vitals taken for this visit.  Physical Examination: General: Well developed, well nourished, NAD  HEENT: OP clear, mucus membranes moist  SKIN: warm, dry. No rashes. Neuro: No focal deficits  Musculoskeletal: Muscle strength 5/5 all ext  Psychiatric: Mood and affect normal  Neck: No JVD, no carotid bruits, no thyromegaly, no lymphadenopathy.  Lungs:Clear bilaterally,  no wheezes, rhonci, crackles Cardiovascular: Regular rate and rhythm. No murmurs, gallops or rubs. Abdomen:Soft. Bowel sounds present. Non-tender.  Extremities: No lower extremity edema. Pulses are 2 + in the bilateral DP/PT.  EKG:  EKG is *** ordered today. The ekg ordered today demonstrates   Recent Labs: No results found for requested labs within last 365 days.   Lipid Panel    Component Value Date/Time   CHOL 225 (H) 08/18/2020 0000   TRIG 68 08/18/2020 0000   HDL 59 08/18/2020 0000   CHOLHDL 3.8 08/18/2020 0000   LDLCALC 154 (H) 08/18/2020 0000     Wt Readings from Last 3 Encounters:  11/21/22 174 lb 3.2 oz (79 kg)  10/19/21 173 lb 12.8 oz (78.8 kg)  08/16/20 166 lb 3.2 oz (75.4 kg)   Assessment and Plan:   1. PACs/PVCs: Cardiac monitor November 2017 with rare PVCs and PACs. Rare palpitations. Continue Cardizem .   2. Aortic valve insufficiency: Mild by echo in September 2024. Repeat echo in 2027.   3. Hyperlipidemia: Lipids followed in primary care. LDL ***. Continue statin.    Labs/ tests ordered today include:  No orders of the defined types were placed in this encounter.  Disposition:   F/U with me in 12 months  Signed, Lonni Cash, MD 11/26/2023 12:43 PM    [facility] [address] [place], [place], [place]  [address] Phone: [phone]; Fax: [phone]

## 2023-11-27 ENCOUNTER — Ambulatory Visit: Attending: Cardiovascular Disease | Admitting: Cardiovascular Disease

## 2023-11-27 ENCOUNTER — Encounter: Payer: Self-pay | Admitting: Cardiovascular Disease

## 2023-11-27 VITALS — BP 114/86 | HR 97 | Ht 63.0 in | Wt 163.4 lb

## 2023-11-27 DIAGNOSIS — R002 Palpitations: Secondary | ICD-10-CM

## 2023-11-27 DIAGNOSIS — I493 Ventricular premature depolarization: Secondary | ICD-10-CM | POA: Diagnosis not present

## 2023-11-27 DIAGNOSIS — I351 Nonrheumatic aortic (valve) insufficiency: Secondary | ICD-10-CM | POA: Diagnosis not present

## 2023-11-27 MED ORDER — DILTIAZEM HCL 30 MG PO TABS: 30.0000 mg | ORAL_TABLET | Freq: Four times a day (QID) | ORAL | 3 refills | Status: AC | PRN

## 2023-11-27 MED ORDER — ROSUVASTATIN CALCIUM 5 MG PO TABS: 5.0000 mg | ORAL_TABLET | Freq: Every day | ORAL | 3 refills | Status: AC

## 2023-11-27 MED ORDER — DILTIAZEM HCL ER COATED BEADS 120 MG PO CP24: 120.0000 mg | ORAL_CAPSULE | Freq: Every day | ORAL | 3 refills | Status: AC

## 2023-11-27 NOTE — Patient Instructions (Signed)
 Medication Instructions:  No changes *If you need a refill on your cardiac medications before your next appointment, please call your pharmacy*  Lab Work: none If you have labs (blood work) drawn today and your tests are completely normal, you will receive your results only by: MyChart Message (if you have MyChart) OR A paper copy in the mail If you have any lab test that is abnormal or we need to change your treatment, we will call you to review the results.  Testing/Procedures: none  Follow-Up: At [facility], you and your health needs are our priority.  As part of our continuing mission to provide you with exceptional heart care, our providers are all part of one team.  This team includes your primary Cardiologist (physician) and Advanced Practice Providers or APPs (Physician Assistants and Nurse Practitioners) who all work together to provide you with the care you need, when you need it.  Your next appointment:   12 month(s)  Provider:   Antoinette Batman, MD
